# Patient Record
Sex: Female | Born: 1970 | Race: White | Hispanic: No | Smoking: Former smoker
Health system: Southern US, Community
[De-identification: ages and names within clinical notes are randomized; demographics above are authoritative.]

## PROBLEM LIST (undated history)

## (undated) DIAGNOSIS — F419 Anxiety disorder, unspecified: Secondary | ICD-10-CM

## (undated) DIAGNOSIS — K8020 Calculus of gallbladder without cholecystitis without obstruction: Secondary | ICD-10-CM

## (undated) DIAGNOSIS — N83209 Unspecified ovarian cyst, unspecified side: Secondary | ICD-10-CM

## (undated) DIAGNOSIS — N189 Chronic kidney disease, unspecified: Secondary | ICD-10-CM

## (undated) DIAGNOSIS — D649 Anemia, unspecified: Secondary | ICD-10-CM

## (undated) DIAGNOSIS — K219 Gastro-esophageal reflux disease without esophagitis: Secondary | ICD-10-CM

## (undated) DIAGNOSIS — D699 Hemorrhagic condition, unspecified: Secondary | ICD-10-CM

## (undated) DIAGNOSIS — G43909 Migraine, unspecified, not intractable, without status migrainosus: Secondary | ICD-10-CM

## (undated) HISTORY — DX: Gastro-esophageal reflux disease without esophagitis: K21.9

## (undated) HISTORY — DX: Anxiety disorder, unspecified: F41.9

---

## 2011-01-09 DIAGNOSIS — N879 Dysplasia of cervix uteri, unspecified: Secondary | ICD-10-CM | POA: Insufficient documentation

## 2011-03-27 HISTORY — PX: LEEP: SHX91

## 2011-03-27 HISTORY — PX: APPENDECTOMY: SHX54

## 2011-05-22 ENCOUNTER — Emergency Department: Payer: Self-pay | Admitting: *Deleted

## 2011-05-23 LAB — CBC
HCT: 42.8 % (ref 35.0–47.0)
MCH: 27.3 pg (ref 26.0–34.0)
MCHC: 32.8 g/dL (ref 32.0–36.0)
RBC: 5.14 10*6/uL (ref 3.80–5.20)
RDW: 15.8 % — ABNORMAL HIGH (ref 11.5–14.5)

## 2011-05-23 LAB — URINALYSIS, COMPLETE
Bacteria: NONE SEEN
Glucose,UR: NEGATIVE mg/dL (ref 0–75)
Leukocyte Esterase: NEGATIVE
Nitrite: NEGATIVE
Ph: 5 (ref 4.5–8.0)
RBC,UR: 8 /HPF (ref 0–5)

## 2011-05-23 LAB — COMPREHENSIVE METABOLIC PANEL
Albumin: 4 g/dL (ref 3.4–5.0)
Anion Gap: 11 (ref 7–16)
BUN: 8 mg/dL (ref 7–18)
Bilirubin,Total: 0.6 mg/dL (ref 0.2–1.0)
Chloride: 108 mmol/L — ABNORMAL HIGH (ref 98–107)
Creatinine: 0.72 mg/dL (ref 0.60–1.30)
EGFR (African American): >60
Glucose: 89 mg/dL (ref 65–99)
Osmolality: 283 (ref 275–301)
Potassium: 4 mmol/L (ref 3.5–5.1)
SGPT (ALT): 52 U/L
Sodium: 143 mmol/L (ref 136–145)
Total Protein: 8 g/dL (ref 6.4–8.2)

## 2011-11-20 ENCOUNTER — Ambulatory Visit: Payer: Self-pay

## 2011-11-27 ENCOUNTER — Ambulatory Visit: Payer: Self-pay | Admitting: Gynecologic Oncology

## 2012-01-01 ENCOUNTER — Ambulatory Visit: Payer: Self-pay

## 2012-08-06 ENCOUNTER — Ambulatory Visit: Payer: Self-pay

## 2013-02-11 ENCOUNTER — Ambulatory Visit: Payer: Self-pay

## 2013-03-29 LAB — CBC WITH DIFFERENTIAL/PLATELET
BASOS ABS: 0.1 10*3/uL (ref 0.0–0.1)
BASOS PCT: 0.4 %
Eosinophil #: 0.1 10*3/uL (ref 0.0–0.7)
Eosinophil %: 0.6 %
HCT: 44.6 % (ref 35.0–47.0)
HGB: 14.7 g/dL (ref 12.0–16.0)
LYMPHS PCT: 15.6 %
Lymphocyte #: 2.3 10*3/uL (ref 1.0–3.6)
MCH: 27.2 pg (ref 26.0–34.0)
MCHC: 32.9 g/dL (ref 32.0–36.0)
MCV: 82 fL (ref 80–100)
MONO ABS: 0.8 x10 3/mm (ref 0.2–0.9)
Monocyte %: 5.7 %
NEUTROS ABS: 11.2 10*3/uL — AB (ref 1.4–6.5)
Neutrophil %: 77.7 %
PLATELETS: 270 10*3/uL (ref 150–440)
RBC: 5.41 10*6/uL — ABNORMAL HIGH (ref 3.80–5.20)
RDW: 13.2 % (ref 11.5–14.5)
WBC: 14.4 10*3/uL — ABNORMAL HIGH (ref 3.6–11.0)

## 2013-03-29 LAB — COMPREHENSIVE METABOLIC PANEL
Albumin: 3.9 g/dL (ref 3.4–5.0)
Alkaline Phosphatase: 110 U/L
Anion Gap: 4 — ABNORMAL LOW (ref 7–16)
BUN: 10 mg/dL (ref 7–18)
Bilirubin,Total: 0.4 mg/dL (ref 0.2–1.0)
Calcium, Total: 9.3 mg/dL (ref 8.5–10.1)
Chloride: 106 mmol/L (ref 98–107)
Co2: 27 mmol/L (ref 21–32)
Creatinine: 0.75 mg/dL (ref 0.60–1.30)
EGFR (African American): >60
GLUCOSE: 99 mg/dL (ref 65–99)
Osmolality: 273 (ref 275–301)
POTASSIUM: 3.6 mmol/L (ref 3.5–5.1)
SGOT(AST): 22 U/L (ref 15–37)
SGPT (ALT): 43 U/L (ref 12–78)
Sodium: 137 mmol/L (ref 136–145)
TOTAL PROTEIN: 7.9 g/dL (ref 6.4–8.2)

## 2013-03-29 LAB — URINALYSIS, COMPLETE
Bacteria: NONE SEEN
Bilirubin,UR: NEGATIVE
Glucose,UR: NEGATIVE mg/dL (ref 0–75)
KETONE: NEGATIVE
LEUKOCYTE ESTERASE: NEGATIVE
Nitrite: NEGATIVE
PH: 6 (ref 4.5–8.0)
Protein: NEGATIVE
RBC,UR: 4 /HPF (ref 0–5)
SPECIFIC GRAVITY: 1.03 (ref 1.003–1.030)
Squamous Epithelial: 1

## 2013-03-29 LAB — LIPASE, BLOOD: Lipase: 131 U/L (ref 73–393)

## 2013-03-30 ENCOUNTER — Observation Stay: Payer: Self-pay | Admitting: Surgery

## 2013-03-31 LAB — PATHOLOGY REPORT

## 2014-02-08 ENCOUNTER — Emergency Department: Payer: Self-pay | Admitting: Emergency Medicine

## 2014-02-08 LAB — BASIC METABOLIC PANEL
Anion Gap: 5 — ABNORMAL LOW (ref 7–16)
BUN: 8 mg/dL (ref 7–18)
CALCIUM: 8.4 mg/dL — AB (ref 8.5–10.1)
CREATININE: 0.89 mg/dL (ref 0.60–1.30)
Chloride: 108 mmol/L — ABNORMAL HIGH (ref 98–107)
Co2: 26 mmol/L (ref 21–32)
EGFR (Non-African Amer.): >60
GLUCOSE: 122 mg/dL — AB (ref 65–99)
Osmolality: 277 (ref 275–301)
Potassium: 4.1 mmol/L (ref 3.5–5.1)
SODIUM: 139 mmol/L (ref 136–145)

## 2014-02-08 LAB — CBC
HCT: 44.8 % (ref 35.0–47.0)
HGB: 14.6 g/dL (ref 12.0–16.0)
MCH: 28.1 pg (ref 26.0–34.0)
MCHC: 32.6 g/dL (ref 32.0–36.0)
MCV: 86 fL (ref 80–100)
Platelet: 285 10*3/uL (ref 150–440)
RBC: 5.2 10*6/uL (ref 3.80–5.20)
RDW: 13.2 % (ref 11.5–14.5)
WBC: 6.9 10*3/uL (ref 3.6–11.0)

## 2014-02-09 LAB — URINALYSIS, COMPLETE
BACTERIA: NONE SEEN
Bilirubin,UR: NEGATIVE
Glucose,UR: NEGATIVE mg/dL (ref 0–75)
KETONE: NEGATIVE
Leukocyte Esterase: NEGATIVE
Nitrite: NEGATIVE
PH: 7 (ref 4.5–8.0)
Protein: NEGATIVE
RBC,UR: 6 /HPF (ref 0–5)
SPECIFIC GRAVITY: 1.021 (ref 1.003–1.030)
Squamous Epithelial: 4
WBC UR: 1 /HPF (ref 0–5)

## 2014-02-09 LAB — TROPONIN I: Troponin-I: <0.02 ng/mL

## 2014-03-31 ENCOUNTER — Ambulatory Visit: Payer: Self-pay

## 2014-07-17 NOTE — Op Note (Signed)
PATIENT NAME:  Ashley Deleon, Ashley Deleon MR#:  [id number] DATE OF BIRTH:  Jan 07, 1971  DATE OF PROCEDURE:  03/30/2013  PREOPERATIVE DIAGNOSIS: Acute appendicitis.   POSTOPERATIVE DIAGNOSIS: Acute appendicitis.   PROCEDURE: Laparoscopic appendectomy.   SURGEON: Phoebe Perch, M.D.   ANESTHESIA: General with endotracheal tube.   INDICATIONS: This is a patient with progressive right lower quadrant pain and tenderness with signs of peritoneal irritation and a CT scan suggesting appendicitis with leukocytosis. Preoperatively, we discussed rationale for surgery, the options of observation and alternative causes for her pain including gallstones and an ovarian cyst. Preoperatively, we also discussed the risks of bleeding, infection, negative laparoscopy and conversion to an open procedure. This was all reviewed for her. She understood and agreed to proceed.   FINDINGS: Left ovarian cyst, photos taken, normal-appearing cyst, acute appendicitis, non-ruptured with minimal inflammation.   DESCRIPTION OF PROCEDURE: The patient was induced to general anesthesia, Foley catheter was placed and she was given IV antibiotics. She was prepped and draped in a sterile fashion. Marcaine was infiltrated in skin and subcutaneous tissues. Around the periumbilical area, an incision was made. Veress needle was placed. Pneumoperitoneum was obtained and a 5 mm trocar port was placed. The abdominal cavity was explored and under direct vision a 12 mm left lateral port was placed and a 5 mm suprapubic port was placed. An ovarian cyst was identified and photographed. It was quite large, but fairly normal-appearing. No other pathology was noted. The appendix was elevated. The base of the appendix was divided with an Endo GIA standard load and then 3 firings of the vascular load Endo GIA was required to control the mesoappendix. The specimen was passed out through the lateral port site with the aid of an Endo Catch bag. The area was checked for  hemostasis. Multiple clips were utilized on the fatty mesoappendix. This controlled a small arterial hemorrhage. The area was irrigated with copious amounts of normal saline. There was no sign of bleeding or bowel injury. Therefore, the left lateral port site was closed under direct vision with multiple 0 Vicryls utilizing an Endo Close technique. Again, hemostasis, found to be adequate. The pneumoperitoneum was released. All ports were removed; 4-0 subcuticular Monocryl was used at all skin edges. Steri-Strips, Mastisol and sterile dressings were placed. The patient tolerated the procedure well. There were no complications. She was taken to the recovery room in stable condition to be admitted for continued care.   ____________________________ Jerrol Banana. Burt Knack, MD rec:aw D: 03/30/2013 09:44:47 ET T: 03/30/2013 09:48:48 ET JOB#: [id number]  cc: Jerrol Banana. Burt Knack, MD, <Dictator> Florene Glen MD ELECTRONICALLY SIGNED 03/30/2013 18:24

## 2014-07-17 NOTE — Discharge Summary (Signed)
PATIENT NAME:  Ashley Deleon, Ashley Deleon MR#:  [id number] DATE OF BIRTH:  15-Jul-1970  DATE OF ADMISSION:  03/30/2013 DATE OF DISCHARGE:  03/31/2013  DIAGNOSES:  1.  Acute appendicitis. 2.  Gallstones. 3.  Left ovarian cyst. 4.  History of kidney stones.   PROCEDURE: Laparoscopic appendectomy.  HISTORY OF PRESENT ILLNESS AND HOSPITAL COURSE: This is a patient who presented with right lower quadrant pain and tenderness with a CT suggesting possible early acute appendicitis, but she also had gallstones as well as a left ovarian cyst. Her physical exam progressed to showing worsening right lower quadrant tenderness. Therefore, she was taken to the operating room where a diagnostic laparoscopy confirmed the presence of acute appendicitis. Appendectomy was performed laparoscopically. Photos were taken of a large left ovarian cyst, which appeared fairly normal and those photos have been given to the patient to take her gynecologist and an appointment for her gynecologist will be made in the next 2 weeks. She also has gallstones with questionable symptoms, which will be dealt with in the office. She is discharged in stable condition, tolerating a regular diet. Her only new medication would be Vicodin as needed for pain and she will follow up in my office in 10 days and with a gynecologist in 2 weeks.   ____________________________ Jerrol Banana Burt Knack, MD rec:aw D: 03/31/2013 08:18:38 ET T: 03/31/2013 08:33:13 ET JOB#: [id number]  cc: Jerrol Banana. Burt Knack, MD, <Dictator> Florene Glen MD ELECTRONICALLY SIGNED 03/31/2013 12:41

## 2014-07-17 NOTE — H&P (Signed)
   Subjective/Chief Complaint RLQ pain, nausea   History of Present Illness Ms. Kaney is a pleasant 44 yo F with a history of nephrolithiasis who presnts with now 7 hours of acute onset RLQ pain.  She says that it began suddenly.  Has not improved.  Worse with movement.  Also associated with some nausea but not no vomiting.  Different from her prior kidney stones in that that pain begins in her back and radiates to her RLQ.  Last bm was yesterday and normal.  No sick contacts, no unusual ingestions.  + subjective chills and shortness of breath.  No pain with urination.  Had a recent history of epigastric pain and was treated for H. pylori.  No anorexia.   Past History H/o nephrolithiasis H/o D and C H/o gestational diabetes H/o leep   Past Med/Surgical Hx:  Gestational Diabetes:   Kidney Stones:   LEEP: 04/24/11  ALLERGIES:  No Known Allergies:   Family and Social History:  Family History Diabetes Mellitus  Cancer  FH of cirrhosis, cervical cancer   Social History negative tobacco, negative ETOH   Place of Living Lives in [place], 5 children   Review of Systems:  Subjective/Chief Complaint RLQ pain   Fever/Chills Yes   Cough No   Sputum No   Abdominal Pain Yes   Diarrhea No   Constipation No   Nausea/Vomiting Yes   SOB/DOE No   Chest Pain No   Dysuria No   Tolerating PT No   Tolerating Diet Nauseated   Physical Exam:  GEN well developed, well nourished, no acute distress, obese   HEENT pale conjunctivae   RESP normal resp effort  clear BS  no use of accessory muscles   CARD regular rate  no murmur  no thrills   ABD positive tenderness  denies Flank Tenderness  no liver/spleen enlargement  no hernia  soft  normal BS  no Abdominal Bruits  no Adominal Mass   EXTR negative cyanosis/clubbing, negative edema   SKIN normal to palpation, No rashes, No ulcers   NEURO cranial nerves intact, negative rigidity, negative tremor, follows commands   PSYCH A+O to  time, place, person, good insight    Assessment/Admission Diagnosis Ms. Wurtz is a pleasant 44 yo F with a history of RLQ pain, nausea.  + WBC 14.  CT scan with enlarged appendix with minimal periappendiceal stranding.  Possible early appendicitis.   Plan Admit for IVF, serial abdominal exam, observation.  Will defer possible appendectomy to daytime surgeon Dr. Burt Knack if he agrees with my assessment and if continues to have pain.   Electronic Signatures: Floyde Parkins (MD)  (Signed 05-Jan-15 04:14)  Authored: CHIEF COMPLAINT and HISTORY, PAST MEDICAL/SURGIAL HISTORY, ALLERGIES, FAMILY AND SOCIAL HISTORY, REVIEW OF SYSTEMS, PHYSICAL EXAM, ASSESSMENT AND PLAN   Last Updated: 05-Jan-15 04:14 by Floyde Parkins (MD)

## 2015-03-19 ENCOUNTER — Encounter: Payer: Self-pay | Admitting: *Deleted

## 2015-03-19 ENCOUNTER — Other Ambulatory Visit: Payer: Self-pay

## 2015-03-19 ENCOUNTER — Emergency Department: Payer: Self-pay

## 2015-03-19 ENCOUNTER — Emergency Department
Admission: EM | Admit: 2015-03-19 | Discharge: 2015-03-19 | Disposition: A | Discharge status: Home / Self Care | Payer: Self-pay | Attending: Emergency Medicine | Admitting: Emergency Medicine

## 2015-03-19 DIAGNOSIS — R1011 Right upper quadrant pain: Secondary | ICD-10-CM

## 2015-03-19 DIAGNOSIS — K8020 Calculus of gallbladder without cholecystitis without obstruction: Secondary | ICD-10-CM

## 2015-03-19 DIAGNOSIS — Z87891 Personal history of nicotine dependence: Secondary | ICD-10-CM | POA: Insufficient documentation

## 2015-03-19 DIAGNOSIS — K30 Functional dyspepsia: Secondary | ICD-10-CM

## 2015-03-19 DIAGNOSIS — Z3202 Encounter for pregnancy test, result negative: Secondary | ICD-10-CM | POA: Insufficient documentation

## 2015-03-19 HISTORY — DX: Calculus of gallbladder without cholecystitis without obstruction: K80.20

## 2015-03-19 HISTORY — DX: Unspecified ovarian cyst, unspecified side: N83.209

## 2015-03-19 HISTORY — DX: Migraine, unspecified, not intractable, without status migrainosus: G43.909

## 2015-03-19 LAB — CBC
HCT: 42.7 % (ref 35.0–47.0)
HEMOGLOBIN: 14.3 g/dL (ref 12.0–16.0)
MCH: 27.9 pg (ref 26.0–34.0)
MCHC: 33.4 g/dL (ref 32.0–36.0)
MCV: 83.4 fL (ref 80.0–100.0)
Platelets: 236 10*3/uL (ref 150–440)
RBC: 5.12 MIL/uL (ref 3.80–5.20)
RDW: 13.3 % (ref 11.5–14.5)
WBC: 10.5 10*3/uL (ref 3.6–11.0)

## 2015-03-19 LAB — URINALYSIS COMPLETE WITH MICROSCOPIC (ARMC ONLY)
BILIRUBIN URINE: NEGATIVE
Bacteria, UA: NONE SEEN
Glucose, UA: NEGATIVE mg/dL
LEUKOCYTES UA: NEGATIVE
NITRITE: NEGATIVE
Protein, ur: 30 mg/dL — AB
SPECIFIC GRAVITY, URINE: 1.031 — AB (ref 1.005–1.030)
pH: 5 (ref 5.0–8.0)

## 2015-03-19 LAB — COMPREHENSIVE METABOLIC PANEL
ALK PHOS: 90 U/L (ref 38–126)
ALT: 56 U/L — ABNORMAL HIGH (ref 14–54)
ANION GAP: 5 (ref 5–15)
AST: 52 U/L — ABNORMAL HIGH (ref 15–41)
Albumin: 4 g/dL (ref 3.5–5.0)
BILIRUBIN TOTAL: 0.5 mg/dL (ref 0.3–1.2)
BUN: 11 mg/dL (ref 6–20)
CALCIUM: 8.8 mg/dL — AB (ref 8.9–10.3)
CO2: 25 mmol/L (ref 22–32)
Chloride: 108 mmol/L (ref 101–111)
Creatinine, Ser: 0.63 mg/dL (ref 0.44–1.00)
GFR calc non Af Amer: >60 mL/min (ref >60)
Glucose, Bld: 125 mg/dL — ABNORMAL HIGH (ref 65–99)
POTASSIUM: 3.7 mmol/L (ref 3.5–5.1)
Sodium: 138 mmol/L (ref 135–145)
TOTAL PROTEIN: 7.2 g/dL (ref 6.5–8.1)

## 2015-03-19 LAB — LIPASE, BLOOD: Lipase: 30 U/L (ref 11–51)

## 2015-03-19 LAB — POCT PREGNANCY, URINE: PREG TEST UR: NEGATIVE

## 2015-03-19 MED ORDER — IOHEXOL 240 MG/ML SOLN
25.0000 mL | Freq: Once | INTRAMUSCULAR | Status: AC | PRN
  Administered 2015-03-19: 25 mL via ORAL

## 2015-03-19 MED ORDER — ONDANSETRON HCL 4 MG/2ML IJ SOLN
4.0000 mg | Freq: Once | INTRAMUSCULAR | Status: AC | PRN
  Administered 2015-03-19: 4 mg via INTRAVENOUS
  Filled 2015-03-19: qty 2

## 2015-03-19 MED ORDER — IOHEXOL 350 MG/ML SOLN
125.0000 mL | Freq: Once | INTRAVENOUS | Status: AC | PRN
  Administered 2015-03-19: 125 mL via INTRAVENOUS

## 2015-03-19 MED ORDER — GI COCKTAIL ~~LOC~~
30.0000 mL | Freq: Once | ORAL | Status: AC
  Administered 2015-03-19: 30 mL via ORAL
  Filled 2015-03-19: qty 30

## 2015-03-19 NOTE — ED Notes (Signed)
Reviewed d/c instructions and follow-up care with pt. Pt verbalized understanding 

## 2015-03-19 NOTE — ED Notes (Signed)
Patient transported to Ultrasound 

## 2015-03-19 NOTE — Discharge Instructions (Signed)
You were evaluated for upper abdominal pain, and found to have gallstones. I also suspect your symptoms may be coming from acid reflux/indigestion.  Take over-the-counter omeprazole 40 mg daily for 2 weeks. You may try Maalox as needed for indigestion relief. Avoid fatty and spicy foods for 2 weeks.  Return to the emergency department for any worsening condition including any worsening abdominal pain, nausea, vomiting blood, chest pain, trouble breathing, black or bloody stools, fever, or any other symptoms concerning to you.   Biliary Colic Biliary colic is a pain in the upper abdomen. The pain:  Is usually felt on the right side of the abdomen, but it may also be felt in the center of the abdomen, just below the breastbone (sternum).  May spread back toward the right shoulder blade.  May be steady or irregular.  May be accompanied by nausea and vomiting. Most of the time, the pain goes away in 1-5 hours. After the most intense pain passes, the abdomen may continue to ache mildly for about 24 hours. Biliary colic is caused by a blockage in the bile duct. The bile duct is a pathway that carries bile--a liquid that helps to digest fats--from the gallbladder to the small intestine. Biliary colic usually occurs after eating, when the digestive system demands bile. The pain develops when muscle cells contract forcefully to try to move the blockage so that bile can get by. HOME CARE INSTRUCTIONS  Take medicines only as directed by your health care provider.  Drink enough fluid to keep your urine clear or pale yellow.  Avoid fatty, greasy, and fried foods. These kinds of foods increase your body's demand for bile.  Avoid any foods that make your pain worse.  Avoid overeating.  Avoid having a large meal after fasting. SEEK MEDICAL CARE IF:  You develop a fever.  Your pain gets worse.  You vomit.  You develop nausea that prevents you from eating and drinking. SEEK IMMEDIATE MEDICAL  CARE IF:  You suddenly develop a fever and shaking chills.  You develop a yellowish discoloration (jaundice) of:  Skin.  Whites of the eyes.  Mucous membranes.  You have continuous or severe pain that is not relieved with medicines.  You have nausea and vomiting that is not relieved with medicines.  You develop dizziness or you faint.   This information is not intended to replace advice given to you by your health care provider. Make sure you discuss any questions you have with your health care provider.   Document Released: 08/13/2005 Document Revised: 07/27/2014 Document Reviewed: 12/22/2013 Elsevier Interactive Patient Education 2016 [place]. Gastroesophageal Reflux Disease, Adult Normally, food travels down the esophagus and stays in the stomach to be digested. However, when a person has gastroesophageal reflux disease (GERD), food and stomach acid move back up into the esophagus. When this happens, the esophagus becomes sore and inflamed. Over time, GERD can create small holes (ulcers) in the lining of the esophagus.  CAUSES This condition is caused by a problem with the muscle between the esophagus and the stomach (lower esophageal sphincter, or LES). Normally, the LES muscle closes after food passes through the esophagus to the stomach. When the LES is weakened or abnormal, it does not close properly, and that allows food and stomach acid to go back up into the esophagus. The LES can be weakened by certain dietary substances, medicines, and medical conditions, including:  Tobacco use.  Pregnancy.  Having a hiatal hernia.  Heavy alcohol use.  Certain  foods and beverages, such as coffee, chocolate, onions, and peppermint. RISK FACTORS This condition is more likely to develop in:  People who have an increased body weight.  People who have connective tissue disorders.  People who use NSAID medicines. SYMPTOMS Symptoms of this condition  include:  Heartburn.  Difficult or painful swallowing.  The feeling of having a lump in the throat.  Abitter taste in the mouth.  Bad breath.  Having a large amount of saliva.  Having an upset or bloated stomach.  Belching.  Chest pain.  Shortness of breath or wheezing.  Ongoing (chronic) cough or a night-time cough.  Wearing away of tooth enamel.  Weight loss. Different conditions can cause chest pain. Make sure to see your health care provider if you experience chest pain. DIAGNOSIS Your health care provider will take a medical history and perform a physical exam. To determine if you have mild or severe GERD, your health care provider may also monitor how you respond to treatment. You may also have other tests, including:  An endoscopy toexamine your stomach and esophagus with a small camera.  A test thatmeasures the acidity level in your esophagus.  A test thatmeasures how much pressure is on your esophagus.  A barium swallow or modified barium swallow to show the shape, size, and functioning of your esophagus. TREATMENT The goal of treatment is to help relieve your symptoms and to prevent complications. Treatment for this condition may vary depending on how severe your symptoms are. Your health care provider may recommend:  Changes to your diet.  Medicine.  Surgery. HOME CARE INSTRUCTIONS Diet  Follow a diet as recommended by your health care provider. This may involve avoiding foods and drinks such as:  Coffee and tea (with or without caffeine).  Drinks that containalcohol.  Energy drinks and sports drinks.  Carbonated drinks or sodas.  Chocolate and cocoa.  Peppermint and mint flavorings.  Garlic and onions.  Horseradish.  Spicy and acidic foods, including peppers, chili powder, curry powder, vinegar, hot sauces, and barbecue sauce.  Citrus fruit juices and citrus fruits, such as oranges, lemons, and limes.  Tomato-based foods, such as  red sauce, chili, salsa, and pizza with red sauce.  Fried and fatty foods, such as donuts, french fries, potato chips, and high-fat dressings.  High-fat meats, such as hot dogs and fatty cuts of red and white meats, such as rib eye steak, sausage, ham, and bacon.  High-fat dairy items, such as whole milk, butter, and cream cheese.  Eat small, frequent meals instead of large meals.  Avoid drinking large amounts of liquid with your meals.  Avoid eating meals during the 2-3 hours before bedtime.  Avoid lying down right after you eat.  Do not exercise right after you eat. General Instructions  Pay attention to any changes in your symptoms.  Take over-the-counter and prescription medicines only as told by your health care provider. Do not take aspirin, ibuprofen, or other NSAIDs unless your health care provider told you to do so.  Do not use any tobacco products, including cigarettes, chewing tobacco, and e-cigarettes. If you need help quitting, ask your health care provider.  Wear loose-fitting clothing. Do not wear anything tight around your waist that causes pressure on your abdomen.  Raise (elevate) the head of your bed 6 inches (15cm).  Try to reduce your stress, such as with yoga or meditation. If you need help reducing stress, ask your health care provider.  If you are overweight, reduce your  weight to an amount that is healthy for you. Ask your health care provider for guidance about a safe weight loss goal.  Keep all follow-up visits as told by your health care provider. This is important. SEEK MEDICAL CARE IF:  You have new symptoms.  You have unexplained weight loss.  You have difficulty swallowing, or it hurts to swallow.  You have wheezing or a persistent cough.  Your symptoms do not improve with treatment.  You have a hoarse voice. SEEK IMMEDIATE MEDICAL CARE IF:  You have pain in your arms, neck, jaw, teeth, or back.  You feel sweaty, dizzy, or  light-headed.  You have chest pain or shortness of breath.  You vomit and your vomit looks like blood or coffee grounds.  You faint.  Your stool is bloody or black.  You cannot swallow, drink, or eat.   This information is not intended to replace advice given to you by your health care provider. Make sure you discuss any questions you have with your health care provider.   Document Released: 12/20/2004 Document Revised: 12/01/2014 Document Reviewed: 07/07/2014 Elsevier Interactive Patient Education [employer].

## 2015-03-19 NOTE — ED Provider Notes (Signed)
[facility] Emergency Department Provider Note   ____________________________________________  Time seen:  I have reviewed the triage vital signs and the triage nursing note.  HISTORY  Chief Complaint Abdominal Pain   Historian Patient  HPI Ashley Deleon is a 44 y.o. female with history of prior gallstones and appendectomy, here for acute onset epigastric upper abd pain few hours ago.  Pos for nausea, no vomiting.  Few days ago had abd cramping followed by diarrhea, but that's resolved.  No fever.  Symptoms date seem to be worse after eating. She had seen a surgeon previously after diagnosis of gallstones, but she was told her gallbladder would "be good for another for 5 years. "  Pain described as moderate.    Past Medical History  Diagnosis Date  . Gallstone   . Migraine   . Ovarian cyst     left    There are no active problems to display for this patient.   Past Surgical History  Procedure Laterality Date  . Appendectomy      No current outpatient prescriptions on file.  Allergies Review of patient's allergies indicates no known allergies.  History reviewed. No pertinent family history.  Social History Social History  Substance Use Topics  . Smoking status: Former Research scientist (life sciences)  . Smokeless tobacco: None  . Alcohol Use: No    Review of Systems  Constitutional: Negative for fever. Eyes: Negative for visual changes. ENT: Negative for sore throat. Cardiovascular: Negative for chest pain. Respiratory: The pain took her breath away, but no shortness of breath or coughing. Gastrointestinal: Negative for vomiting and diarrhea. Genitourinary: Negative for dysuria. Musculoskeletal: Negative for back pain. Skin: Negative for rash. Neurological: Negative for headache. 10 point Review of Systems otherwise negative ____________________________________________   PHYSICAL EXAM:  VITAL SIGNS: ED Triage Vitals  Enc Vitals Group     BP  03/19/15 1603 140/56 mmHg     Pulse Rate 03/19/15 1603 73     Resp 03/19/15 1603 16     Temp 03/19/15 1603 98.2 F (36.8 C)     Temp Source 03/19/15 1603 Oral     SpO2 03/19/15 1603 100 %     Weight 03/19/15 1603 260 lb (117.935 kg)     Height 03/19/15 1603 5\' 9"  (1.753 m)     Head Cir --      Peak Flow --      Pain Score 03/19/15 1557 6     Pain Loc --      Pain Edu? --      Excl. in [place]? --      Constitutional: Alert and oriented. Well appearing and in no distress. Eyes: Conjunctivae are normal. PERRL. Normal extraocular movements. ENT   Head: Normocephalic and atraumatic.   Nose: No congestion/rhinnorhea.   Mouth/Throat: Mucous membranes are moist.   Neck: No stridor. Cardiovascular/Chest: Normal rate, regular rhythm.  No murmurs, rubs, or gallops. Respiratory: Normal respiratory effort without tachypnea nor retractions. Breath sounds are clear and equal bilaterally. No wheezes/rales/rhonchi. Gastrointestinal: Soft. No distention, no guarding, no rebound. Moderate ruq, luq and epigastric pain  Genitourinary/rectal:Deferred Musculoskeletal: Nontender with normal range of motion in all extremities. No joint effusions.  No lower extremity tenderness.  No edema. Neurologic:  Normal speech and language. No gross or focal neurologic deficits are appreciated. Skin:  Skin is warm, dry and intact. No rash noted. Psychiatric: Mood and affect are normal. Speech and behavior are normal. Patient exhibits appropriate insight and judgment.  ____________________________________________  EKG I, Lisa Roca, MD, the attending physician have personally viewed and interpreted all ECGs.  66 bpm. Normal sinus rhythm. Narrow QRS. Normal axis. Normal ST and T-wave ____________________________________________  LABS (pertinent positives/negatives)  Lipase 30 Comprehensive metabolic panel significant for AST 52, a LT 56 otherwise without significant abnormality White blood count  10.5, hemoglobin 14.3 and platelet count 236  ____________________________________________  RADIOLOGY All Xrays were viewed by me. Imaging interpreted by Radiologist.  Ultrasound right upper quadrant:  IMPRESSION: 1. Cholelithiasis without other ultrasound evidence of cholecystitis or biliary obstruction. 2. Two nonspecific liver lesions, possibly benign hemangiomas but nonspecific. These were not evident on the previous noncontrast CT scans. Recommend elective outpatient liver protocol MR or CT with contrast, which may be diagnostic for benign disease and help exclude primary or secondary neoplasm.  CT abdomen and pelvis with contrast:  IMPRESSION: 1. Benign-appearing lesions of the liver are compatible with hemangiomas. 2. Cholelithiasis without cholecystitis. 3. 3.5 cm cyst in the right ovary. This is almost certainly benign, and no specific imaging follow up is recommended according to the Society of Radiologists in Ultrasound 2010 Consensus Conference Statement (D Clovis Riley et al. Management of Asymptomatic Ovarian and Other Adnexal Cysts Imaged at [place]: Society of Radiologists in [place] Statement 2010. Radiology 256 (Sept 2010): [id number].). __________________________________________  PROCEDURES  Procedure(s) performed: None  Critical Care performed: None  ____________________________________________   ED COURSE / ASSESSMENT AND PLAN  CONSULTATIONS: None  Pertinent labs & imaging results that were available during my care of the patient were reviewed by me and considered in my medical decision making (see chart for details).   Symptoms in the epigastrium sound most likely like acid reflux/indigestion, but with history of gallstones, and minimally elevated AST and LT, patient was imaged with an ultrasound. There were no findings of emergency condition such as obstruction or infection.  I discussed with the patient the finding of the liver  lesions, and she wanted to proceed with CT of the abdomen/pelvis.  CT shows liver lesions to be consistent with benign hemangiomas.  Patient feels much better, is okay for outpatient management and follow-up.  Patient / Family / Caregiver informed of clinical course, medical decision-making process, and agree with plan.   I discussed return precautions, follow-up instructions, and discharged instructions with patient and/or family.  ___________________________________________   FINAL CLINICAL IMPRESSION(S) / ED DIAGNOSES   Final diagnoses:  RUQ pain  Gallstones  Indigestion       Lisa Roca, MD 03/19/15 2033

## 2015-03-19 NOTE — ED Notes (Addendum)
Pt reports sudden onset of upper right and left abd pain that radiates to back and shoulders. Pt reports at 1400 today having an episode of SOB and became diaphoretic. Pt denies SOB at this time. Pt reports having a hx of gallstones. PT reports nausea but denies vomiting.. Pt reports pain increased after eating a meal today.

## 2015-04-15 ENCOUNTER — Ambulatory Visit (INDEPENDENT_AMBULATORY_CARE_PROVIDER_SITE_OTHER): Payer: BLUE CROSS/BLUE SHIELD | Admitting: Surgery

## 2015-04-15 ENCOUNTER — Encounter: Payer: Self-pay | Admitting: Surgery

## 2015-04-15 VITALS — BP 124/79 | HR 69 | Temp 98.1°F | Ht 69.0 in | Wt 264.8 lb

## 2015-04-15 DIAGNOSIS — K8020 Calculus of gallbladder without cholecystitis without obstruction: Secondary | ICD-10-CM | POA: Diagnosis not present

## 2015-04-15 NOTE — Patient Instructions (Signed)
You are requesting to have your gallbladder removed. We will arrange this to be done the week of 05/09/15.   Please refer to your [facility]) Pre-care information sheet you have been given today.

## 2015-04-15 NOTE — Progress Notes (Signed)
  Surgical Consultation  04/15/2015  Ashley Deleon is an 45 y.o. female.   CC: Gallstones  HPI: This a patient with a long-standing knowledge of gallstones who is had recurrent epigastric and right upper quadrant pain sometimes left upper quadrant pain with nausea but no emesis. She's been to the emergency room. Fevers or chills no jaundice or acholic stools. This is often but not always postprandial, often with FFI.  Past Medical History  Diagnosis Date  . Gallstone   . Migraine   . Ovarian cyst     left    Past Surgical History  Procedure Laterality Date  . Appendectomy      No family history on file.  Social History:  reports that she has quit smoking. She does not have any smokeless tobacco history on file. She reports that she does not drink alcohol or use illicit drugs.  Allergies: No Known Allergies  Medications reviewed.   Review of Systems:   Review of Systems  Constitutional: Negative for fever and chills.  HENT: Negative.   Eyes: Negative.   Respiratory: Negative.   Cardiovascular: Negative.   Gastrointestinal: Positive for heartburn, nausea and abdominal pain. Negative for vomiting, diarrhea, constipation, blood in stool and melena.       Right upper and left upper quadrant pain with epigastric pain often postprandial  Genitourinary: Negative.   Musculoskeletal: Negative.   Skin: Negative.   Neurological: Negative.   Endo/Heme/Allergies: Negative.   Psychiatric/Behavioral: Negative.      Physical Exam:  There were no vitals taken for this visit.  Physical Exam  Constitutional: She is oriented to person, place, and time and well-developed, well-nourished, and in no distress. No distress.  HENT:  Head: Normocephalic and atraumatic.  Eyes: Pupils are equal, round, and reactive to light. Right eye exhibits no discharge. Left eye exhibits no discharge. No scleral icterus.  Neck: Normal range of motion.  Cardiovascular: Normal rate, regular rhythm and  normal heart sounds.   Pulmonary/Chest: Effort normal and breath sounds normal. No respiratory distress. She has no wheezes. She has no rales.  Abdominal: Soft. She exhibits no distension. There is no tenderness. There is no rebound and no guarding.  Musculoskeletal: Normal range of motion. She exhibits no edema.  Lymphadenopathy:    She has no cervical adenopathy.  Neurological: She is alert and oriented to person, place, and time.  Skin: Skin is warm and dry. She is not diaphoretic.  Psychiatric: Mood and affect normal.  Vitals reviewed.     No results found for this or any previous visit (from the past 48 hour(s)). No results found.  Assessment/Plan:  This patient with known gallstones and slightly elevated liver function tests on the single draw. She would likely benefit from laparoscopic cholecystectomy for control of the symptoms. The options of observation of been reviewed the risks of bleeding infection recurrence of symptoms failure to resolve her symptoms (procedure bile duct damage bile duct leak retained common bile duct stone any of which could require further surgery and/or ERCP stent and papillotomy were all reviewed with her she understood and agreed to proceed  Florene Glen, MD, FACS

## 2015-04-18 ENCOUNTER — Telehealth: Payer: Self-pay | Admitting: Surgery

## 2015-04-18 NOTE — Telephone Encounter (Signed)
Ashley Deleon, patient would like to speak with you about her insurance and her upcoming surgery - 05/10/15 with Dr Burt Knack. She is unsure if her insurance will cover surgeries and if it doesn't what her options would be. Please call and advise. Thanks.

## 2015-04-18 NOTE — Telephone Encounter (Signed)
I have called patient back after checking her outpatient benefits. Patient has been advised that her insurance does not cover inpatient or outpatient services. I have discussed payment options with her and she stated that she will call be back before the end of tomorrow's business day with a decision on keeping her surgery date of 05/10/15.  Physician estimate: [phone].

## 2015-04-19 NOTE — Telephone Encounter (Signed)
Pt advised of pre op date/time and sx date. Sx: 05/10/15 with Dr Maeola Sarah cholecystectomy Pre op: 05/02/15 between 9-1:00pm--Phone.   Patient has been advised that her insurance does not cover outpatient services and she stated that she will come in and make a payment on surgery deposit of 500.00 prior to surgery.

## 2015-05-02 ENCOUNTER — Inpatient Hospital Stay: Admission: RE | Admit: 2015-05-02 | Payer: Self-pay | Source: Ambulatory Visit

## 2015-05-02 ENCOUNTER — Encounter: Payer: Self-pay | Admitting: *Deleted

## 2015-05-02 NOTE — Patient Instructions (Signed)
  Your procedure is scheduled on: 05-10-15 (TUESDAY) Report to [facility] To find out your arrival time please call [phone] between 1PM - 3PM on 05-09-15 [facility])  Remember: Instructions that are not followed completely may result in serious medical risk, up to and including death, or upon the discretion of your surgeon and anesthesiologist your surgery may need to be rescheduled.    _X___ 1. Do not eat food or drink liquids after midnight. No gum chewing or hard candies.     _X___ 2. No Alcohol for 24 hours before or after surgery.   ____ 3. Bring all medications with you on the day of surgery if instructed.    _X___ 4. Notify your doctor if there is any change in your medical condition     (cold, fever, infections).     Do not wear jewelry, make-up, hairpins, clips or nail polish.  Do not wear lotions, powders, or perfumes. You may wear deodorant.  Do not shave 48 hours prior to surgery. Men may shave face and neck.  Do not bring valuables to the hospital.    [facility] is not responsible for any belongings or valuables.               Contacts, dentures or bridgework may not be worn into surgery.  Leave your suitcase in the car. After surgery it may be brought to your room.  For patients admitted to the hospital, discharge time is determined by your treatment team.   Patients discharged the day of surgery will not be allowed to drive home.   Please read over the following fact sheets that you were given:     ____ Take these medicines the morning of surgery with A SIP OF WATER:    1. NONE  2.   3.   4.  5.  6.  ____ Fleet Enema (as directed)   _X___ Use CHG Soap as directed  ____ Use inhalers on the day of surgery  ____ Stop metformin 2 days prior to surgery    ____ Take 1/2 of usual insulin dose the night before surgery and none on the morning of surgery.   ____ Stop Coumadin/Plavix/aspirin-N/A  _X___ Stop  Anti-inflammatories-STOP IBUPROFEN NOW-NO NSAIDS OR ASPIRIN PRODUCTS-TYLENOL OK TO TAKE   ____ Stop supplements until after surgery.    ____ Bring C-Pap to the hospital.

## 2015-05-04 ENCOUNTER — Encounter
Admission: RE | Admit: 2015-05-04 | Discharge: 2015-05-04 | Disposition: A | Discharge status: Home / Self Care | Payer: BLUE CROSS/BLUE SHIELD | Source: Ambulatory Visit | Attending: Surgery | Admitting: Surgery

## 2015-05-04 DIAGNOSIS — Z01812 Encounter for preprocedural laboratory examination: Secondary | ICD-10-CM | POA: Insufficient documentation

## 2015-05-04 LAB — CBC WITH DIFFERENTIAL/PLATELET
BASOS ABS: 0 10*3/uL (ref 0–0.1)
BASOS PCT: 0 %
EOS PCT: 2 %
Eosinophils Absolute: 0.1 10*3/uL (ref 0–0.7)
HEMATOCRIT: 41.9 % (ref 35.0–47.0)
Hemoglobin: 14 g/dL (ref 12.0–16.0)
LYMPHS PCT: 27 %
Lymphs Abs: 1.8 10*3/uL (ref 1.0–3.6)
MCH: 27.7 pg (ref 26.0–34.0)
MCHC: 33.3 g/dL (ref 32.0–36.0)
MCV: 83.1 fL (ref 80.0–100.0)
MONO ABS: 0.5 10*3/uL (ref 0.2–0.9)
Monocytes Relative: 8 %
NEUTROS ABS: 4.1 10*3/uL (ref 1.4–6.5)
Neutrophils Relative %: 63 %
PLATELETS: 236 10*3/uL (ref 150–440)
RBC: 5.04 MIL/uL (ref 3.80–5.20)
RDW: 13.2 % (ref 11.5–14.5)
WBC: 6.6 10*3/uL (ref 3.6–11.0)

## 2015-05-04 LAB — COMPREHENSIVE METABOLIC PANEL WITH GFR
ALT: 24 U/L (ref 14–54)
AST: 17 U/L (ref 15–41)
Albumin: 3.9 g/dL (ref 3.5–5.0)
Alkaline Phosphatase: 84 U/L (ref 38–126)
Anion gap: 6 (ref 5–15)
BUN: 16 mg/dL (ref 6–20)
CO2: 25 mmol/L (ref 22–32)
Calcium: 9.3 mg/dL (ref 8.9–10.3)
Chloride: 108 mmol/L (ref 101–111)
Creatinine, Ser: 0.58 mg/dL (ref 0.44–1.00)
GFR calc Af Amer: >60 mL/min
GFR calc non Af Amer: >60 mL/min
Glucose, Bld: 89 mg/dL (ref 65–99)
Potassium: 4.2 mmol/L (ref 3.5–5.1)
Sodium: 139 mmol/L (ref 135–145)
Total Bilirubin: 0.4 mg/dL (ref 0.3–1.2)
Total Protein: 7.2 g/dL (ref 6.5–8.1)

## 2015-05-10 ENCOUNTER — Encounter
Admission: RE | Disposition: A | Discharge status: Home / Self Care | Payer: Self-pay | Source: Ambulatory Visit | Attending: Surgery

## 2015-05-10 ENCOUNTER — Ambulatory Visit
Admission: RE | Admit: 2015-05-10 | Discharge: 2015-05-10 | Disposition: A | Discharge status: Home / Self Care | Payer: BLUE CROSS/BLUE SHIELD | Source: Ambulatory Visit | Attending: Surgery | Admitting: Surgery

## 2015-05-10 ENCOUNTER — Encounter: Payer: Self-pay | Admitting: *Deleted

## 2015-05-10 ENCOUNTER — Ambulatory Visit: Payer: BLUE CROSS/BLUE SHIELD | Admitting: Anesthesiology

## 2015-05-10 DIAGNOSIS — Z87442 Personal history of urinary calculi: Secondary | ICD-10-CM | POA: Diagnosis not present

## 2015-05-10 DIAGNOSIS — K8050 Calculus of bile duct without cholangitis or cholecystitis without obstruction: Secondary | ICD-10-CM | POA: Insufficient documentation

## 2015-05-10 DIAGNOSIS — Z9049 Acquired absence of other specified parts of digestive tract: Secondary | ICD-10-CM | POA: Insufficient documentation

## 2015-05-10 DIAGNOSIS — Z8049 Family history of malignant neoplasm of other genital organs: Secondary | ICD-10-CM | POA: Insufficient documentation

## 2015-05-10 DIAGNOSIS — Z9889 Other specified postprocedural states: Secondary | ICD-10-CM | POA: Insufficient documentation

## 2015-05-10 DIAGNOSIS — N83202 Unspecified ovarian cyst, left side: Secondary | ICD-10-CM | POA: Insufficient documentation

## 2015-05-10 DIAGNOSIS — Z79899 Other long term (current) drug therapy: Secondary | ICD-10-CM | POA: Insufficient documentation

## 2015-05-10 DIAGNOSIS — Z833 Family history of diabetes mellitus: Secondary | ICD-10-CM | POA: Insufficient documentation

## 2015-05-10 DIAGNOSIS — Z79891 Long term (current) use of opiate analgesic: Secondary | ICD-10-CM | POA: Diagnosis not present

## 2015-05-10 DIAGNOSIS — K8080 Other cholelithiasis without obstruction: Secondary | ICD-10-CM | POA: Diagnosis present

## 2015-05-10 DIAGNOSIS — K219 Gastro-esophageal reflux disease without esophagitis: Secondary | ICD-10-CM | POA: Insufficient documentation

## 2015-05-10 DIAGNOSIS — G43909 Migraine, unspecified, not intractable, without status migrainosus: Secondary | ICD-10-CM | POA: Diagnosis not present

## 2015-05-10 DIAGNOSIS — K8010 Calculus of gallbladder with chronic cholecystitis without obstruction: Secondary | ICD-10-CM | POA: Insufficient documentation

## 2015-05-10 DIAGNOSIS — Z791 Long term (current) use of non-steroidal anti-inflammatories (NSAID): Secondary | ICD-10-CM | POA: Insufficient documentation

## 2015-05-10 HISTORY — PX: CHOLECYSTECTOMY: SHX55

## 2015-05-10 HISTORY — DX: Chronic kidney disease, unspecified: N18.9

## 2015-05-10 HISTORY — DX: Hemorrhagic condition, unspecified: D69.9

## 2015-05-10 HISTORY — DX: Anemia, unspecified: D64.9

## 2015-05-10 LAB — POCT PREGNANCY, URINE: Preg Test, Ur: NEGATIVE

## 2015-05-10 SURGERY — LAPAROSCOPIC CHOLECYSTECTOMY WITH INTRAOPERATIVE CHOLANGIOGRAM
Anesthesia: General | Wound class: Clean Contaminated

## 2015-05-10 MED ORDER — MIDAZOLAM HCL 2 MG/2ML IJ SOLN
INTRAMUSCULAR | Status: DC | PRN
  Administered 2015-05-10: 2 mg via INTRAVENOUS

## 2015-05-10 MED ORDER — PROMETHAZINE HCL 25 MG/ML IJ SOLN: 6.2500 mg | INTRAMUSCULAR | Status: DC | PRN

## 2015-05-10 MED ORDER — FAMOTIDINE 20 MG PO TABS
20.0000 mg | ORAL_TABLET | Freq: Once | ORAL | Status: AC
  Administered 2015-05-10: 20 mg via ORAL

## 2015-05-10 MED ORDER — HEPARIN SODIUM (PORCINE) 5000 UNIT/ML IJ SOLN
5000.0000 [IU] | Freq: Once | INTRAMUSCULAR | Status: AC
  Administered 2015-05-10: 5000 [IU] via SUBCUTANEOUS

## 2015-05-10 MED ORDER — BUPIVACAINE-EPINEPHRINE (PF) 0.25% -1:200000 IJ SOLN
INTRAMUSCULAR | Status: DC | PRN
  Administered 2015-05-10: 30 mL

## 2015-05-10 MED ORDER — FENTANYL CITRATE (PF) 100 MCG/2ML IJ SOLN
INTRAMUSCULAR | Status: AC
  Administered 2015-05-10: 25 ug via INTRAVENOUS
  Filled 2015-05-10: qty 2

## 2015-05-10 MED ORDER — PROPOFOL 10 MG/ML IV BOLUS
INTRAVENOUS | Status: DC | PRN
  Administered 2015-05-10: 150 mg via INTRAVENOUS

## 2015-05-10 MED ORDER — HYDROCODONE-ACETAMINOPHEN 5-300 MG PO TABS: 1.0000 | ORAL_TABLET | ORAL | Status: DC | PRN

## 2015-05-10 MED ORDER — SUGAMMADEX SODIUM 500 MG/5ML IV SOLN
INTRAVENOUS | Status: DC | PRN
  Administered 2015-05-10: 239.6 mg via INTRAVENOUS

## 2015-05-10 MED ORDER — FENTANYL CITRATE (PF) 100 MCG/2ML IJ SOLN
25.0000 ug | INTRAMUSCULAR | Status: AC | PRN
  Administered 2015-05-10 (×6): 25 ug via INTRAVENOUS

## 2015-05-10 MED ORDER — DEXTROSE 5 % IV SOLN
3.0000 g | INTRAVENOUS | Status: AC
  Administered 2015-05-10: 3 g via INTRAVENOUS
  Filled 2015-05-10: qty 3000

## 2015-05-10 MED ORDER — LACTATED RINGERS IV SOLN
INTRAVENOUS | Status: DC
  Administered 2015-05-10: 50 mL/h via INTRAVENOUS

## 2015-05-10 MED ORDER — BUPIVACAINE-EPINEPHRINE (PF) 0.25% -1:200000 IJ SOLN
INTRAMUSCULAR | Status: AC
  Filled 2015-05-10: qty 30

## 2015-05-10 MED ORDER — KETOROLAC TROMETHAMINE 30 MG/ML IJ SOLN
INTRAMUSCULAR | Status: DC | PRN
  Administered 2015-05-10: 30 mg via INTRAVENOUS

## 2015-05-10 MED ORDER — FENTANYL CITRATE (PF) 100 MCG/2ML IJ SOLN
INTRAMUSCULAR | Status: DC | PRN
  Administered 2015-05-10: 50 ug via INTRAVENOUS
  Administered 2015-05-10: 150 ug via INTRAVENOUS
  Administered 2015-05-10: 50 ug via INTRAVENOUS

## 2015-05-10 MED ORDER — ROCURONIUM BROMIDE 100 MG/10ML IV SOLN
INTRAVENOUS | Status: DC | PRN
  Administered 2015-05-10: 40 mg via INTRAVENOUS
  Administered 2015-05-10: 10 mg via INTRAVENOUS

## 2015-05-10 MED ORDER — FAMOTIDINE 20 MG PO TABS
ORAL_TABLET | ORAL | Status: AC
  Administered 2015-05-10: 20 mg via ORAL
  Filled 2015-05-10: qty 1

## 2015-05-10 MED ORDER — DEXAMETHASONE SODIUM PHOSPHATE 10 MG/ML IJ SOLN
INTRAMUSCULAR | Status: DC | PRN
  Administered 2015-05-10: 10 mg via INTRAVENOUS

## 2015-05-10 MED ORDER — HEPARIN SODIUM (PORCINE) 5000 UNIT/ML IJ SOLN
INTRAMUSCULAR | Status: AC
  Administered 2015-05-10: 5000 [IU] via SUBCUTANEOUS
  Filled 2015-05-10: qty 1

## 2015-05-10 MED ORDER — CHLORHEXIDINE GLUCONATE 4 % EX LIQD: 1.0000 "application " | Freq: Once | CUTANEOUS | Status: DC

## 2015-05-10 SURGICAL SUPPLY — 43 items
ADHESIVE MASTISOL STRL (MISCELLANEOUS) ×3 IMPLANT
APPLIER CLIP ROT 10 11.4 M/L (STAPLE) ×3
BLADE SURG SZ11 CARB STEEL (BLADE) ×3 IMPLANT
CANISTER SUCT 1200ML W/VALVE (MISCELLANEOUS) ×3 IMPLANT
CATH CHOLANGI 4FR 420404F (CATHETERS) IMPLANT
CHLORAPREP W/TINT 26ML (MISCELLANEOUS) ×3 IMPLANT
CLIP APPLIE ROT 10 11.4 M/L (STAPLE) ×1 IMPLANT
CLOSURE WOUND 1/2 X4 (GAUZE/BANDAGES/DRESSINGS) ×1
CONRAY 60ML FOR OR (MISCELLANEOUS) IMPLANT
DRAPE C-ARM XRAY 36X54 (DRAPES) IMPLANT
ELECT REM PT RETURN 9FT ADLT (ELECTROSURGICAL) ×3
ELECTRODE REM PT RTRN 9FT ADLT (ELECTROSURGICAL) ×1 IMPLANT
ENDOPOUCH RETRIEVER 10 (MISCELLANEOUS) ×3 IMPLANT
GAUZE SPONGE NON-WVN 2X2 STRL (MISCELLANEOUS) ×4 IMPLANT
GLOVE BIO SURGEON STRL SZ8 (GLOVE) ×9 IMPLANT
GOWN STRL REUS W/ TWL LRG LVL3 (GOWN DISPOSABLE) ×4 IMPLANT
GOWN STRL REUS W/TWL LRG LVL3 (GOWN DISPOSABLE) ×8
IRRIGATION STRYKERFLOW (MISCELLANEOUS) IMPLANT
IRRIGATOR STRYKERFLOW (MISCELLANEOUS)
IV CATH ANGIO 12GX3 LT BLUE (NEEDLE) IMPLANT
IV NS 1000ML (IV SOLUTION)
IV NS 1000ML BAXH (IV SOLUTION) IMPLANT
JACKSON PRATT 10 (INSTRUMENTS) IMPLANT
KIT RM TURNOVER STRD PROC AR (KITS) ×3 IMPLANT
LABEL OR SOLS (LABEL) ×3 IMPLANT
NDL SAFETY 22GX1.5 (NEEDLE) ×3 IMPLANT
NEEDLE VERESS 14GA 120MM (NEEDLE) ×3 IMPLANT
NS IRRIG 500ML POUR BTL (IV SOLUTION) ×3 IMPLANT
PACK LAP CHOLECYSTECTOMY (MISCELLANEOUS) ×3 IMPLANT
SCISSORS METZENBAUM CVD 33 (INSTRUMENTS) ×3 IMPLANT
SLEEVE ENDOPATH XCEL 5M (ENDOMECHANICALS) ×6 IMPLANT
SPONGE EXCIL AMD DRAIN 4X4 6P (MISCELLANEOUS) IMPLANT
SPONGE LAP 18X18 5 PK (GAUZE/BANDAGES/DRESSINGS) ×3 IMPLANT
SPONGE VERSALON 2X2 STRL (MISCELLANEOUS) ×8
STRIP CLOSURE SKIN 1/2X4 (GAUZE/BANDAGES/DRESSINGS) ×2 IMPLANT
SUT MNCRL 4-0 (SUTURE) ×4
SUT MNCRL 4-0 27XMFL (SUTURE) ×2
SUT VICRYL 0 AB UR-6 (SUTURE) ×3 IMPLANT
SUTURE MNCRL 4-0 27XMF (SUTURE) ×2 IMPLANT
SYR 20CC LL (SYRINGE) ×3 IMPLANT
TROCAR XCEL NON-BLD 11X100MML (ENDOMECHANICALS) ×3 IMPLANT
TROCAR XCEL NON-BLD 5MMX100MML (ENDOMECHANICALS) ×3 IMPLANT
TUBING INSUFFLATOR HI FLOW (MISCELLANEOUS) ×3 IMPLANT

## 2015-05-10 NOTE — Transfer of Care (Signed)
Immediate Anesthesia Transfer of Care Note  Patient: Ashley Deleon  Procedure(s) Performed: Procedure(s): LAPAROSCOPIC CHOLECYSTECTOMY WITH INTRAOPERATIVE CHOLANGIOGRAM (N/A)  Patient Location: PACU  Anesthesia Type:General  Level of Consciousness: patient cooperative and lethargic  Airway & Oxygen Therapy: Patient Spontanous Breathing and Patient connected to face mask oxygen  Post-op Assessment: Post -op Vital signs reviewed and stable  Post vital signs: Reviewed and stable  Last Vitals:  Filed Vitals:   05/10/15 0612 05/10/15 0822  BP: 125/82 139/73  Pulse: 77 86  Temp: 36.7 C 36.5 C  Resp: 16 20    Complications: No apparent anesthesia complications

## 2015-05-10 NOTE — Anesthesia Postprocedure Evaluation (Signed)
Anesthesia Post Note  Patient: Tsering Lampkin  Procedure(s) Performed: Procedure(s) (LRB): LAPAROSCOPIC CHOLECYSTECTOMY WITH INTRAOPERATIVE CHOLANGIOGRAM (N/A)  Patient location during evaluation: PACU Anesthesia Type: General Level of consciousness: awake and alert Pain management: pain level controlled Vital Signs Assessment: post-procedure vital signs reviewed and stable Respiratory status: spontaneous breathing, nonlabored ventilation, respiratory function stable and patient connected to nasal cannula oxygen Cardiovascular status: blood pressure returned to baseline and stable Postop Assessment: no signs of nausea or vomiting Anesthetic complications: no    Last Vitals:  Filed Vitals:   05/10/15 0928 05/10/15 1030  BP: 109/62 106/60  Pulse: 80 65  Temp: 36.8 C 36.7 C  Resp: 16 16    Last Pain:  Filed Vitals:   05/10/15 1043  PainSc: 3                  Martha Clan

## 2015-05-10 NOTE — Progress Notes (Signed)
Preoperative Review   Patient is met in the preoperative holding area. The history is reviewed in the chart and with the patient. I personally reviewed the options and rationale as well as the risks of this procedure that have been previously discussed with the patient. All questions asked by the patient and/or family were answered to their satisfaction.  Patient agrees to proceed with this procedure at this time.  Eve Rey E Nefi Musich M.D. FACS  

## 2015-05-10 NOTE — Anesthesia Preprocedure Evaluation (Signed)
Anesthesia Evaluation  Patient identified by MRN, date of birth, ID band Patient awake    Reviewed: Allergy & Precautions, H&P , NPO status , Patient's Chart, lab work & pertinent test results, reviewed documented beta blocker date and time   History of Anesthesia Complications Negative for: history of anesthetic complications  Airway Mallampati: III  TM Distance: >3 FB Neck ROM: full    Dental no notable dental hx. (+) Missing, Poor Dentition   Pulmonary neg shortness of breath, neg sleep apnea, neg COPD, neg recent URI, former smoker,    Pulmonary exam normal breath sounds clear to auscultation       Cardiovascular Exercise Tolerance: Good negative cardio ROS Normal cardiovascular exam Rhythm:regular Rate:Normal     Neuro/Psych  Headaches, neg Seizures PSYCHIATRIC DISORDERS (Anxiety)    GI/Hepatic Neg liver ROS, GERD  ,  Endo/Other  neg diabetesMorbid obesity  Renal/GU Renal disease (kidney stones)  negative genitourinary   Musculoskeletal   Abdominal   Peds  Hematology negative hematology ROS (+)   Anesthesia Other Findings Past Medical History:   Gallstone                                                    Migraine                                                     Ovarian cyst                                                   Comment:left   Anxiety                                                      GERD (gastroesophageal reflux disease)                       Chronic kidney disease                                         Comment:H/O KIDNEY STONES   Anemia                                                         Comment:H/O   Bleeds easily (HCC)                                            Comment:BLED EASILY DURING LEEP PROCEDURE PER PT   Reproductive/Obstetrics negative OB ROS  Anesthesia Physical Anesthesia Plan  ASA: III  Anesthesia Plan:  General   Post-op Pain Management:    Induction:   Airway Management Planned:   Additional Equipment:   Intra-op Plan:   Post-operative Plan:   Informed Consent: I have reviewed the patients History and Physical, chart, labs and discussed the procedure including the risks, benefits and alternatives for the proposed anesthesia with the patient or authorized representative who has indicated his/her understanding and acceptance.   Dental Advisory Given  Plan Discussed with: Anesthesiologist, CRNA and Surgeon  Anesthesia Plan Comments:         Anesthesia Quick Evaluation

## 2015-05-10 NOTE — Anesthesia Procedure Notes (Signed)
Procedure Name: Intubation Date/Time: 05/10/2015 7:39 AM Performed by: Jonna Clark Pre-anesthesia Checklist: Patient identified, Patient being monitored, Timeout performed, Emergency Drugs available and Suction available Patient Re-evaluated:Patient Re-evaluated prior to inductionOxygen Delivery Method: Circle system utilized Preoxygenation: Pre-oxygenation with 100% oxygen Intubation Type: IV induction Ventilation: Mask ventilation without difficulty Laryngoscope Size: Mac and 3 Grade View: Grade I Tube type: Oral Tube size: 7.0 mm Number of attempts: 1 Placement Confirmation: ETT inserted through vocal cords under direct vision,  positive ETCO2 and breath sounds checked- equal and bilateral Secured at: 21 cm Tube secured with: Tape Dental Injury: Teeth and Oropharynx as per pre-operative assessment

## 2015-05-10 NOTE — Op Note (Signed)
Laparoscopic Cholecystectomy  Pre-operative Diagnosis: Biliary colic  Post-operative Diagnosis: Biliary colic  Procedure: Lap or O scopic cholecystectomy  Surgeon: Jerrol Banana. Burt Knack, MD FACS  Anesthesia: Gen. with endotracheal tube  Assistant:P A student  Procedure Details  The patient was seen again in the Holding Room. The benefits, complications, treatment options, and expected outcomes were discussed with the patient. The risks of bleeding, infection, recurrence of symptoms, failure to resolve symptoms, bile duct damage, bile duct leak, retained common bile duct stone, bowel injury, any of which could require further surgery and/or ERCP, stent, or papillotomy were reviewed with the patient. The likelihood of improving the patient's symptoms with return to their baseline status is good.  The patient and/or family concurred with the proposed plan, giving informed consent.  The patient was taken to Operating Room, identified as Ashley Deleon and the procedure verified as Laparoscopic Cholecystectomy.  A Time Out was held and the above information confirmed.  Prior to the induction of general anesthesia, antibiotic prophylaxis was administered. VTE prophylaxis was in place. General endotracheal anesthesia was then administered and tolerated well. After the induction, the abdomen was prepped with Chloraprep and draped in the sterile fashion. The patient was positioned in the supine position.  Local anesthetic  was injected into the skin near the umbilicus and an incision made. The Veress needle was placed. Pneumoperitoneum was then created with CO2 and tolerated well without any adverse changes in the patient's vital signs. A 45mm port was placed in the periumbilical position and the abdominal cavity was explored.  Two 5-mm ports were placed in the right upper quadrant and a 12 mm epigastric port was placed all under direct vision. All skin incisions  were infiltrated with a local anesthetic agent  before making the incision and placing the trocars.   The patient was positioned  in reverse Trendelenburg, tilted slightly to the patient's left.  The gallbladder was identified, the fundus grasped and retracted cephalad. Adhesions were lysed bluntly. The infundibulum was grasped and retracted laterally, exposing the peritoneum overlying the triangle of Calot. This was then divided and exposed in a blunt fashion. A critical view of the cystic duct and cystic artery was obtained.  The cystic duct was clearly identified and bluntly dissected.   A branch of the cystic artery was doubly clipped and divided this allowed for good visualization of the cystic duct as it entered the infundibulum of the gallbladder. Here it was doubly clipped and divided a second branch of the cystic artery was doubly clipped and divided.  The gallbladder was taken from the gallbladder fossa in a retrograde fashion with the electrocautery. The gallbladder was removed and placed in an Endocatch bag. The liver bed was irrigated and inspected. Hemostasis was achieved with the electrocautery. Copious irrigation was utilized and was repeatedly aspirated until clear.  The gallbladder and Endocatch sac were then removed through the epigastric port site.   Inspection of the right upper quadrant was performed. No bleeding, bile duct injury or leak, or bowel injury was noted. Pneumoperitoneum was released.  The epigastric port site was closed with figure-of-eight 0 Vicryl sutures. 4-0 subcuticular Monocryl was used to close the skin. Steristrips and Mastisol and sterile dressings were  applied.  The patient was then extubated and brought to the recovery room in stable condition. Sponge, lap, and needle counts were correct at closure and at the conclusion of the case.   Findings: Chronic Cholecystitis with gallstones Estimated Blood Loss: 25 cc  Drains: None         Specimens: Gallbladder           Complications: none                Ikey Omary E. Burt Knack, MD, FACS

## 2015-05-10 NOTE — Discharge Instructions (Signed)
Remove dressing in 24 hours. °May shower in 24 hours. °Leave paper strips in place. °Resume all home medications. °Follow-up with Dr. Cooper in 10 days. ° °AMBULATORY SURGERY  °DISCHARGE INSTRUCTIONS ° ° °1) The drugs that you were given will stay in your system until tomorrow so for the next 24 hours you should not: ° °A) Drive an automobile °B) Make any legal decisions °C) Drink any alcoholic beverage ° ° °2) You may resume regular meals tomorrow.  Today it is better to start with liquids and gradually work up to solid foods. ° °You may eat anything you prefer, but it is better to start with liquids, then soup and crackers, and gradually work up to solid foods. ° ° °3) Please notify your doctor immediately if you have any unusual bleeding, trouble breathing, redness and pain at the surgery site, drainage, fever, or pain not relieved by medication. ° ° ° °4) Additional Instructions: ° ° ° ° ° ° ° °Please contact your physician with any problems or Same Day Surgery at [phone], Monday through Friday 6 am to 4 pm, or [place] [facility] [place] Main number at [phone]. °

## 2015-05-11 LAB — SURGICAL PATHOLOGY

## 2015-05-13 ENCOUNTER — Telehealth: Payer: Self-pay | Admitting: General Surgery

## 2015-05-13 NOTE — Telephone Encounter (Signed)
Patient called and said you were suppose to call her back regarding [facility].

## 2015-05-13 NOTE — Telephone Encounter (Signed)
I have called patient back and patient was advised to call physician billing at [phone] to discuss refunds. Patient understands that we can not refund payment from a past visit.

## 2015-05-16 ENCOUNTER — Telehealth: Payer: Self-pay

## 2015-05-16 NOTE — Telephone Encounter (Signed)
Pt has an appt with Dr. Azalee Course on 05/19/15 for a post op lap chole. Pt has requested to move her appt due to having another appt that day. She would like to come to [facility] because it would be much easier on her. I advised her Dr. Burt Knack is there on Friday the 24th but his schedule is full. She would like a call back to see if she can be fit in that day. Please call pt. Thanks!

## 2015-05-16 NOTE — Telephone Encounter (Signed)
Returned phone call to patient. Patient wanted to see Dr. Burt Knack in the [facility] in the morning. Discussed this with patient. She decided that she would rather see someone else in the [facility] as long it was a morning appointment.   Appointment made for 3/8 with Dr. Dahlia Byes in [place].

## 2015-05-19 ENCOUNTER — Ambulatory Visit: Payer: BLUE CROSS/BLUE SHIELD | Admitting: Surgery

## 2015-06-01 ENCOUNTER — Ambulatory Visit: Payer: Self-pay | Admitting: Surgery

## 2015-06-08 ENCOUNTER — Encounter: Payer: Self-pay | Admitting: Surgery

## 2015-06-08 ENCOUNTER — Ambulatory Visit (INDEPENDENT_AMBULATORY_CARE_PROVIDER_SITE_OTHER): Payer: BLUE CROSS/BLUE SHIELD | Admitting: Surgery

## 2015-06-08 VITALS — BP 122/82 | HR 67 | Temp 98.6°F | Ht 69.0 in | Wt 255.6 lb

## 2015-06-08 DIAGNOSIS — Z09 Encounter for follow-up examination after completed treatment for conditions other than malignant neoplasm: Secondary | ICD-10-CM

## 2015-06-08 NOTE — Patient Instructions (Signed)
Please call with any questions or concerns.

## 2015-06-08 NOTE — Progress Notes (Signed)
s/post laparoscopic cholecystectomy by Dr. Burt Knack. She is doing very well, no major complaints. She is tolerating by mouth and having bowel movements. She is afebrile  PE: No acute distress awake alert Abdomen: Soft, nontender, incisions clean dry and intact no evidence of infection. Very minimal erythema in the umbilical port.  A/P Status post laparoscopic cholecystectomy doing well. Pathology discussed with the patient as well as postoperative course. No heavy lifting. Return to clinic when necessary

## 2015-08-25 ENCOUNTER — Telehealth: Payer: Self-pay | Admitting: Surgery

## 2015-08-25 NOTE — Telephone Encounter (Signed)
Spoke with Patient regarding her lower right side pain. She was instructed to call her PCP provider for the pain due to it not being related to her surgery back in February.Patient verbalized understanding.

## 2015-08-25 NOTE — Telephone Encounter (Signed)
Patient had LAPAROSCOPIC CHOLECYSTECTOMY  with Dr Burt Knack on 05/10/15. She is now having pain on her lower right side and is concerned it may have something to do with her surgery. She would like to speak with the nurse.

## 2015-08-31 ENCOUNTER — Other Ambulatory Visit: Payer: Self-pay | Admitting: Obstetrics and Gynecology

## 2015-08-31 DIAGNOSIS — R102 Pelvic and perineal pain: Secondary | ICD-10-CM

## 2015-09-02 ENCOUNTER — Ambulatory Visit: Payer: BLUE CROSS/BLUE SHIELD

## 2019-02-27 ENCOUNTER — Telehealth: Payer: Self-pay | Admitting: Obstetrics & Gynecology

## 2019-02-27 NOTE — Telephone Encounter (Signed)
There have been several attempts to reach this patient in reguards to scheduling the requested referral. The attempts have been unsuccessful. Please notify your referral coordinator and medical records department. If I may be anymore of assistance please let me know. Thank you for Choosing [facility] for you patients. ° °Sara Paschal ° °

## 2019-03-30 ENCOUNTER — Other Ambulatory Visit: Payer: Self-pay

## 2019-03-30 ENCOUNTER — Other Ambulatory Visit: Payer: Self-pay | Admitting: *Deleted

## 2019-03-30 DIAGNOSIS — Z0000 Encounter for general adult medical examination without abnormal findings: Secondary | ICD-10-CM

## 2019-03-31 ENCOUNTER — Other Ambulatory Visit (HOSPITAL_COMMUNITY)
Admission: RE | Admit: 2019-03-31 | Discharge: 2019-03-31 | Disposition: A | Discharge status: Home / Self Care | Payer: Medicaid Other | Source: Ambulatory Visit | Attending: Obstetrics & Gynecology | Admitting: Obstetrics & Gynecology

## 2019-03-31 ENCOUNTER — Ambulatory Visit
Admission: RE | Admit: 2019-03-31 | Discharge: 2019-03-31 | Disposition: A | Discharge status: Home / Self Care | Payer: Medicaid Other | Source: Ambulatory Visit | Attending: Oncology | Admitting: Oncology

## 2019-03-31 ENCOUNTER — Ambulatory Visit: Payer: Self-pay | Attending: Oncology | Admitting: *Deleted

## 2019-03-31 ENCOUNTER — Other Ambulatory Visit: Payer: Self-pay

## 2019-03-31 ENCOUNTER — Encounter: Payer: Self-pay | Admitting: *Deleted

## 2019-03-31 ENCOUNTER — Ambulatory Visit (INDEPENDENT_AMBULATORY_CARE_PROVIDER_SITE_OTHER): Payer: Self-pay | Admitting: Obstetrics & Gynecology

## 2019-03-31 ENCOUNTER — Encounter: Payer: Self-pay | Admitting: Obstetrics & Gynecology

## 2019-03-31 VITALS — BP 120/80 | Ht 69.0 in | Wt 246.0 lb

## 2019-03-31 DIAGNOSIS — Z0000 Encounter for general adult medical examination without abnormal findings: Secondary | ICD-10-CM | POA: Insufficient documentation

## 2019-03-31 DIAGNOSIS — R87611 Atypical squamous cells cannot exclude high grade squamous intraepithelial lesion on cytologic smear of cervix (ASC-H): Secondary | ICD-10-CM

## 2019-03-31 DIAGNOSIS — R87610 Atypical squamous cells of undetermined significance on cytologic smear of cervix (ASC-US): Secondary | ICD-10-CM

## 2019-03-31 DIAGNOSIS — D260 Other benign neoplasm of cervix uteri: Secondary | ICD-10-CM | POA: Insufficient documentation

## 2019-03-31 DIAGNOSIS — N63 Unspecified lump in unspecified breast: Secondary | ICD-10-CM

## 2019-03-31 NOTE — Progress Notes (Signed)
Referring Provider:  [facility]  HPI:  Ashley Deleon is a 49 y.o.  [id number]  who presents today for evaluation and management of abnormal cervical cytology.    Dysplasia History:  ASC-H by 02/2019 PAP Prior LEEP 2013 Normal PAP on intermitttant basis since that time  ROS:  Pertinent items are noted in HPI.  OB History  Gravida Para Term Preterm AB Living  7 5 5   2 5   SAB TAB Ectopic Multiple Live Births  2            # Outcome Date GA Lbr Len/2nd Weight Sex Delivery Anes PTL Lv  7 SAB           6 SAB           5 Term           4 Term           3 Term           2 Term           1 Term             Past Medical History:  Diagnosis Date   Anemia    H/O   Anxiety    Bleeds easily ([place])    BLED EASILY DURING LEEP PROCEDURE PER PT   Chronic kidney disease    H/O KIDNEY STONES   Gallstone    GERD (gastroesophageal reflux disease)    Migraine    Ovarian cyst    left    Past Surgical History:  Procedure Laterality Date   APPENDECTOMY  2013   Dr. Pat Patrick   CHOLECYSTECTOMY N/A 05/10/2015   Procedure: LAPAROSCOPIC CHOLECYSTECTOMY WITH INTRAOPERATIVE CHOLANGIOGRAM;  Surgeon: Florene Glen, MD;  Location: [facility] ORS;  Service: General;  Laterality: N/A;   LEEP  2013   [facility]    SOCIAL HISTORY: Social History   Substance and Sexual Activity  Alcohol Use No   Social History   Substance and Sexual Activity  Drug Use No     Family History  Problem Relation Age of Onset   Gallstones Brother     ALLERGIES:  Patient has no known allergies.  Current Outpatient Medications on File Prior to Visit  Medication Sig Dispense Refill   etonogestrel (IMPLANON) 68 MG IMPL implant 1 each by Subdermal route once.     acetaminophen (TYLENOL) 500 MG tablet Take 500 mg by mouth every 6 (six) hours as needed.     Butalbital-APAP-Caffeine (FIORICET PO) Take 1 tablet by mouth as needed (Migraines).     No current facility-administered medications on file  prior to visit.    Physical Exam: -Vitals:  BP 120/80    Ht 5\' 9"  (1.753 m)    Wt 246 lb (111.6 kg)    BMI 36.33 kg/m  GEN: WD, WN, NAD.  A+ O x 3, good mood and affect. ABD:  NT, ND.  Soft, no masses.  No hernias noted.   Pelvic:   Vulva: Normal appearance.  No lesions.  Vagina: No lesions or abnormalities noted.  Support: Normal pelvic support.  Urethra No masses tenderness or scarring.  Meatus Normal size without lesions or prolapse.  Cervix: See below.  Anus: Normal exam.  No lesions.  Perineum: Normal exam.  No lesions.        Bimanual   Uterus: Normal size.  Non-tender.  Mobile.  AV.  Adnexae: No masses.  Non-tender to palpation.  Cul-de-sac: Negative for abnormality.  PROCEDURE: 1.  Urine Pregnancy Test:  not done 2.  Colposcopy performed with 4% acetic acid after verbal consent obtained                                         -Aceto-white Lesions Location(s): none.              -Biopsy performed at 6 o'clock               -ECC indicated and performed: Yes.       -Biopsy sites made hemostatic with pressure, AgNO3, and/or Monsel's solution   -Satisfactory colposcopy: Yes.      -Evidence of Invasive cervical CA :  NO  ASSESSMENT:  Ashley Deleon is a 49 y.o. [id number] here for  1. Atypical squamous cells cannot exclude high grade squamous intraepithelial lesion on cytologic smear of cervix (ASC-H)   .  PLAN: 1.  I discussed the grading system of pap smears and HPV high risk viral types.  We will discuss and base management after colpo results return. 2. Follow up PAP 6 months, vs intervention if high grade dysplasia identified 3. Treatment of persistantly abnormal PAP smears and cervical dysplasia, even mild, is discussed w pt today in detail, as well as the pros and cons of Cryo and LEEP procedures. Will consider and discuss after results.      Barnett Applebaum, MD, [facility], [facility] 03/31/2019  11:27 AM

## 2019-03-31 NOTE — Progress Notes (Signed)
Patient referred to [facility] by [facility] for financial assistance for further evaluation of recent abnormal pap of HPV positive ASCUS-H.  There is no pap for review.  Due to Covid 19 pandemic a televisit was used to enroll patient into our BCCCP program and complete her health history.  Verbal consent given.  Patient is scheduled to see Dr. Kenton Kingfisher today at [facility] for colposcopy and possible biopsy.  Since she is 20, she is also scheduled for a screening mammogram today since she denies any breast problems.  Will follow up per BCCCP protocol.  See gail breast cancer risk assessment below.   Risk Assessment    No risk assessment data for the current encounter   Risk Scores      03/30/2019   Last edited by: Orson Slick, CMA   5-year risk: 0.8 %   Lifetime risk: 8.5 %

## 2019-03-31 NOTE — Patient Instructions (Signed)

## 2019-03-31 NOTE — Addendum Note (Signed)
Addended by: Gae Dry on: 03/31/2019 01:53 PM   Modules accepted: Orders

## 2019-04-03 LAB — SURGICAL PATHOLOGY

## 2019-04-03 NOTE — Progress Notes (Signed)
PAP appt 6 mos

## 2019-04-10 ENCOUNTER — Ambulatory Visit
Admission: RE | Admit: 2019-04-10 | Discharge: 2019-04-10 | Disposition: A | Discharge status: Home / Self Care | Payer: Self-pay | Source: Ambulatory Visit | Attending: Oncology | Admitting: Oncology

## 2019-04-10 DIAGNOSIS — N63 Unspecified lump in unspecified breast: Secondary | ICD-10-CM

## 2019-04-16 ENCOUNTER — Other Ambulatory Visit: Payer: Self-pay | Admitting: *Deleted

## 2019-04-16 DIAGNOSIS — N6489 Other specified disorders of breast: Secondary | ICD-10-CM

## 2019-04-23 ENCOUNTER — Telehealth: Payer: Self-pay

## 2019-04-23 NOTE — Telephone Encounter (Signed)
Pt calling; had colpo on the 5th; has had a d/c ever since; kind of like a yeast inf; didn't have it before the colpl.  [phone]  Pt states it started out clear and now yellowish and has had an odor since it started.  It does irritate and is itchy.  Adv monistat and if no better to call to be seen and not to use anything down there the night before appt.

## 2019-04-29 ENCOUNTER — Encounter: Payer: Self-pay | Admitting: *Deleted

## 2019-04-29 NOTE — Telephone Encounter (Signed)
Pt calling; has had d/c since colpo; adv to use 3 or 7d tx otc; is no better.  [phone]

## 2019-04-29 NOTE — Progress Notes (Signed)
Patient called today and states she has been having a discharge since her colposcopy on 03/31/19.  Patient states she does not feel like this is normal.  States she tried an OTC med for yeast infection, but thinks it may be worse.  States the discharge is yellow and odorous.  Patient is scheduled to follow up with Dr. Kenton Kingfisher on Februray 7, 2021 for further evaluation.  She is aware she will follow up with Dr. Kenton Kingfisher in 6 months post her colposcopy also.

## 2019-04-29 NOTE — Telephone Encounter (Signed)
Spoke to patient and offered appointment  with Dr. Kenton Kingfisher for Thursday, 04/30/19, Let patient know we aren't sure if BCCCP will cover appointment and that Medicaid family planning waiver will not cover appointment and she may receive a bill. Patient decline appointment.

## 2019-04-29 NOTE — Telephone Encounter (Signed)
Appt

## 2019-04-29 NOTE — Telephone Encounter (Signed)
Spoke with Sheena, patient scheduled 2/4 with [facility], BCCCP flag.

## 2019-04-30 ENCOUNTER — Other Ambulatory Visit: Payer: Self-pay

## 2019-04-30 ENCOUNTER — Ambulatory Visit (INDEPENDENT_AMBULATORY_CARE_PROVIDER_SITE_OTHER): Payer: Self-pay | Admitting: Obstetrics & Gynecology

## 2019-04-30 ENCOUNTER — Encounter: Payer: Self-pay | Admitting: Obstetrics & Gynecology

## 2019-04-30 VITALS — BP 100/60 | Ht 69.0 in | Wt 247.0 lb

## 2019-04-30 DIAGNOSIS — N898 Other specified noninflammatory disorders of vagina: Secondary | ICD-10-CM

## 2019-04-30 NOTE — Progress Notes (Signed)
HPI:      [name] is a 49 y.o. [id number] who LMP was No LMP recorded. Patient has had an implant., presents today for a problem visit.  She complains of:  Vaginitis: Patient complains of an abnormal vaginal discharge for 4 weeks.  This has been since the procedure for colposcopy and biopsies related to her ASCUS PAP and the biopsies ended up being normal.  Vaginal symptoms include burning.Vulvar symptoms include none.STI Risk: Very low risk of STD exposureDischarge described as: yellow, bloody and malodorous.Other associated symptoms: none.Menstrual pattern: She had been bleeding irregularly. Contraception: Nexplanon  PMHx: She  has a past medical history of Anemia, Anxiety, Bleeds easily ([place]), Chronic kidney disease, Gallstone, GERD (gastroesophageal reflux disease), Migraine, and Ovarian cyst. Also,  has a past surgical history that includes Appendectomy (2013); LEEP (2013); and Cholecystectomy (N/A, 05/10/2015)., family history includes Breast cancer (age of onset: 68) in an other family member; Gallstones in her brother.,  reports that she quit smoking about 21 years ago. She has never used smokeless tobacco. She reports that she does not drink alcohol or use drugs.  She has a current medication list which includes the following prescription(s): etonogestrel, acetaminophen, and butalbital-apap-caffeine. Also, has No Known Allergies.  Review of Systems  Constitutional: Negative for chills, fever and malaise/fatigue.  HENT: Negative for congestion, sinus pain and sore throat.   Eyes: Negative for blurred vision and pain.  Respiratory: Negative for cough and wheezing.   Cardiovascular: Negative for chest pain and leg swelling.  Gastrointestinal: Negative for abdominal pain, constipation, diarrhea, heartburn, nausea and vomiting.  Genitourinary: Negative for dysuria, frequency, hematuria and urgency.  Musculoskeletal: Negative for back pain, joint pain, myalgias and neck pain.  Skin:  Negative for itching and rash.  Neurological: Negative for dizziness, tremors and weakness.  Endo/Heme/Allergies: Does not bruise/bleed easily.  Psychiatric/Behavioral: Negative for depression. The patient is not nervous/anxious and does not have insomnia.   All other systems reviewed and are negative.   Objective: BP 100/60   Ht 5\' 9"  (1.753 m)   Wt 247 lb (112 kg)   BMI 36.48 kg/m  Physical Exam Constitutional:      General: She is not in acute distress.    Appearance: She is well-developed.  Genitourinary:     Pelvic exam was performed with patient supine.     Vagina and uterus normal.     No vaginal erythema or bleeding.     No cervical motion tenderness, discharge, polyp or nabothian cyst.     Uterus is mobile.     Uterus is not enlarged.     No uterine mass detected.    Uterus is midaxial.     No right or left adnexal mass present.     Right adnexa not tender.     Left adnexa not tender.     Genitourinary Comments: Cx healing w ith contact bleeding w qtip  HENT:     Head: Normocephalic and atraumatic.     Nose: Nose normal.  Abdominal:     General: There is no distension.     Palpations: Abdomen is soft.     Tenderness: There is no abdominal tenderness.  Musculoskeletal:        General: Normal range of motion.  Neurological:     Mental Status: She is alert and oriented to person, place, and time.     Cranial Nerves: No cranial nerve deficit.  Skin:    General: Skin is warm and dry.  Psychiatric:  Attention and Perception: Attention normal.        Mood and Affect: Mood and affect normal.        Speech: Speech normal.        Behavior: Behavior normal.        Thought Content: Thought content normal.        Judgment: Judgment normal.     ASSESSMENT/PLAN:    Problem List Items Addressed This Visit       Vaginal discharge    Related to healing from biopsies of cervix, no s/sx BV or other infection  Monitor for improvement or worsening    Ashley Applebaum, MD, [facility] 04/30/2019  2:25 PM

## 2019-05-21 ENCOUNTER — Encounter: Payer: Self-pay | Admitting: *Deleted

## 2019-05-21 NOTE — Progress Notes (Signed)
Letter mailed to inform patient of her 6 mont follow up mammogram appointment on 10/12/19 @ 11:00.  Dr. Doreene Adas office is to schedule patient's 6 month follow up pap smear per his request.

## 2019-10-01 ENCOUNTER — Ambulatory Visit: Payer: Medicaid Other | Admitting: Obstetrics & Gynecology

## 2019-10-12 ENCOUNTER — Other Ambulatory Visit: Payer: Medicaid Other

## 2021-04-02 IMAGING — MG DIGITAL SCREENING BILAT W/ TOMO W/ CAD
8 series · 8 of 24 positions shown · non-contrast
Comparison: Previous exam(s).

CLINICAL DATA: Screening.

EXAM:
DIGITAL SCREENING BILATERAL MAMMOGRAM WITH TOMO AND CAD

[L CC synth-2D]
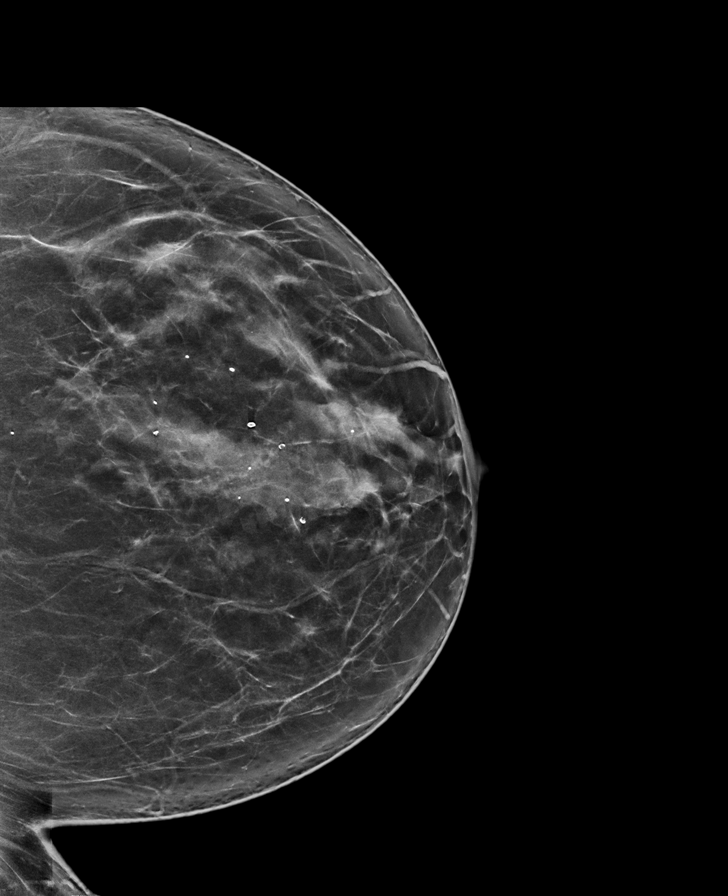

[R CC synth-2D]
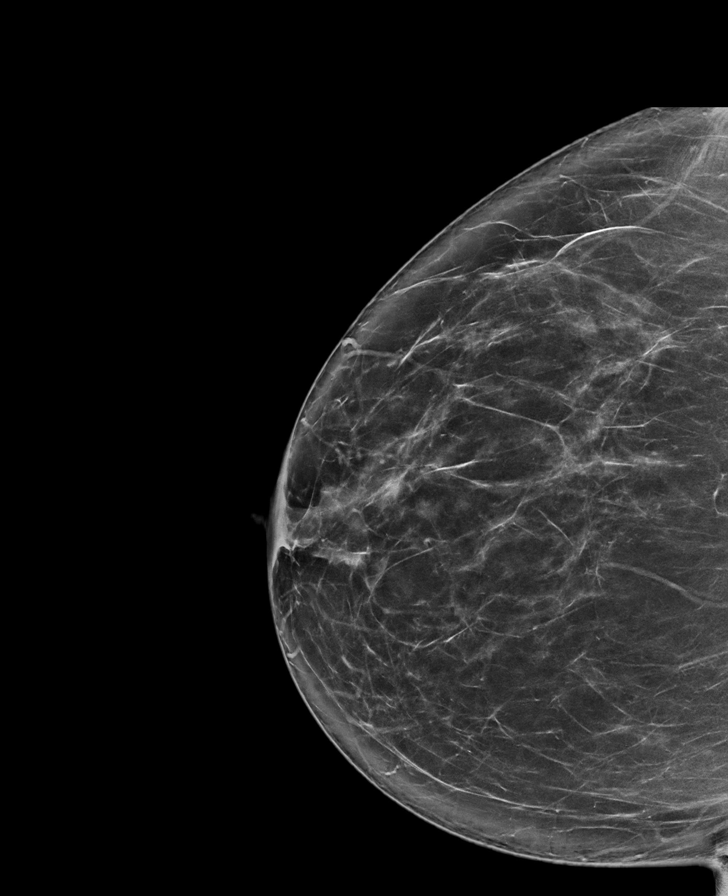

[L MLO synth-2D]
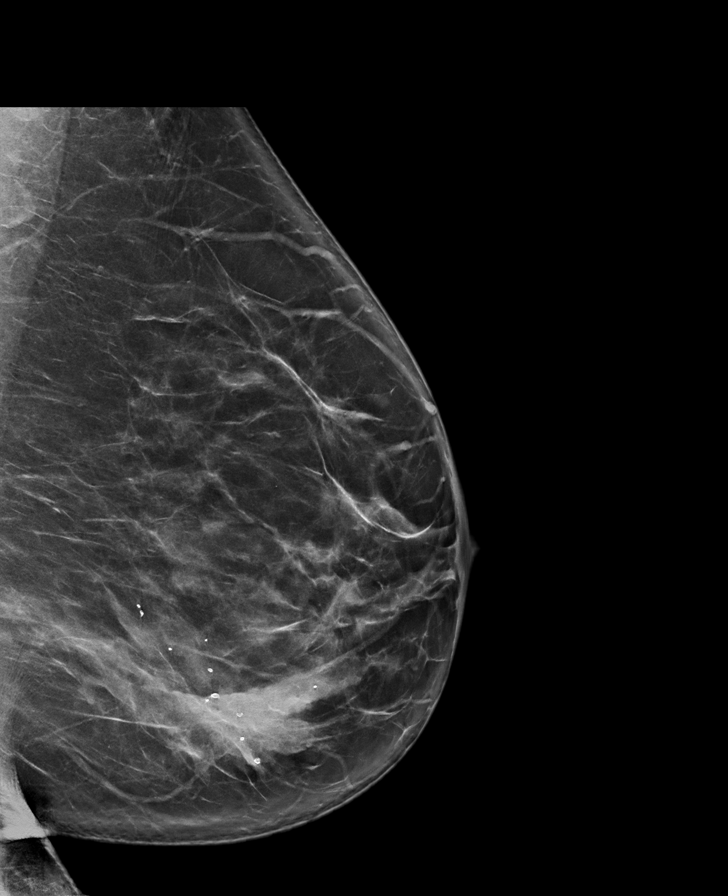

[R MLO synth-2D]
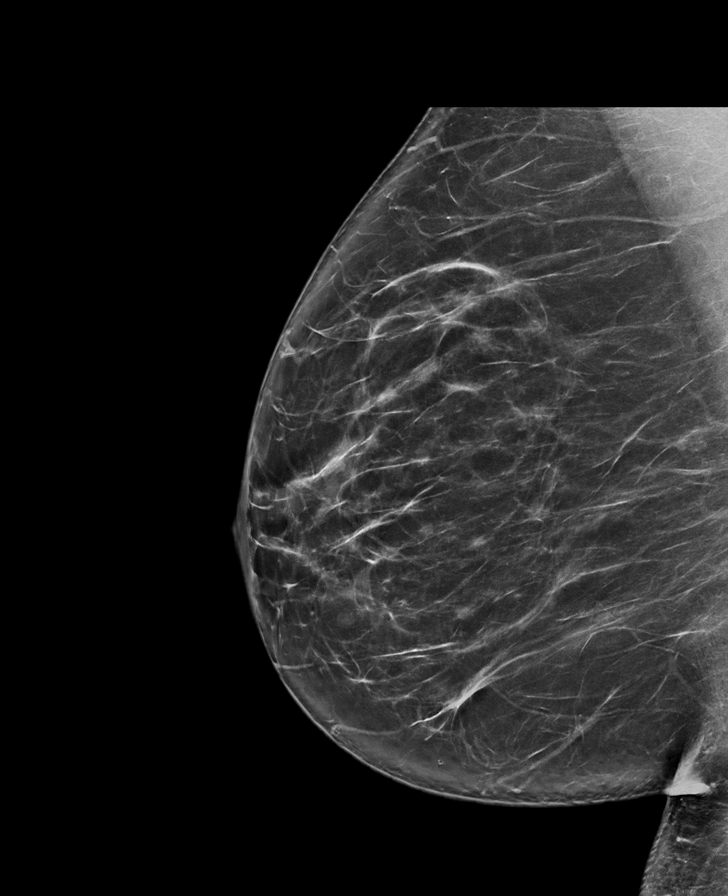

[L CC tomo · tomo slice 44/87.0]
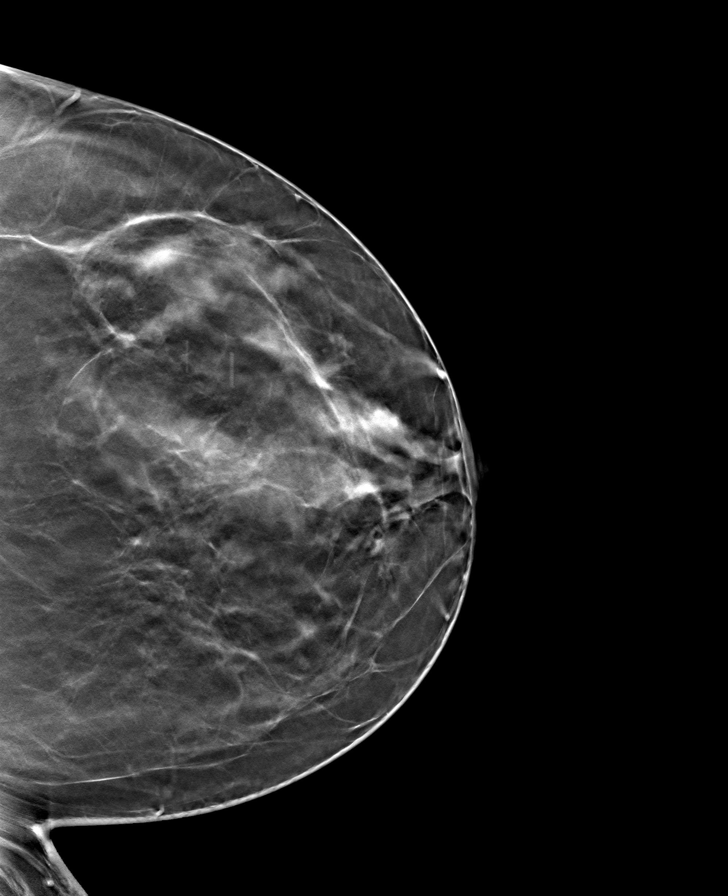

[L MLO tomo · tomo slice 49/96.0]
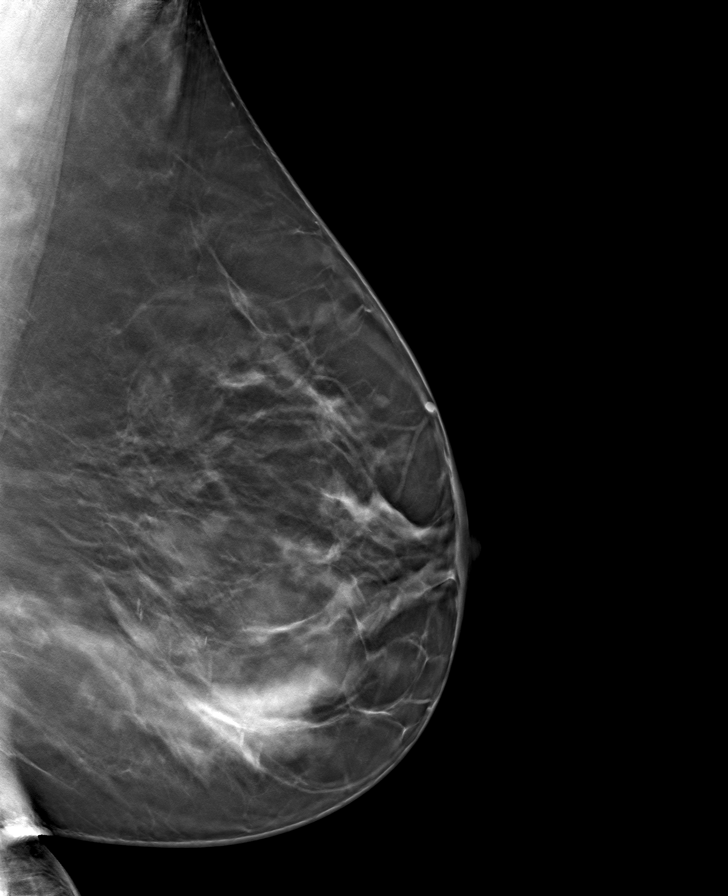

[R CC tomo · tomo slice 45/88.0]
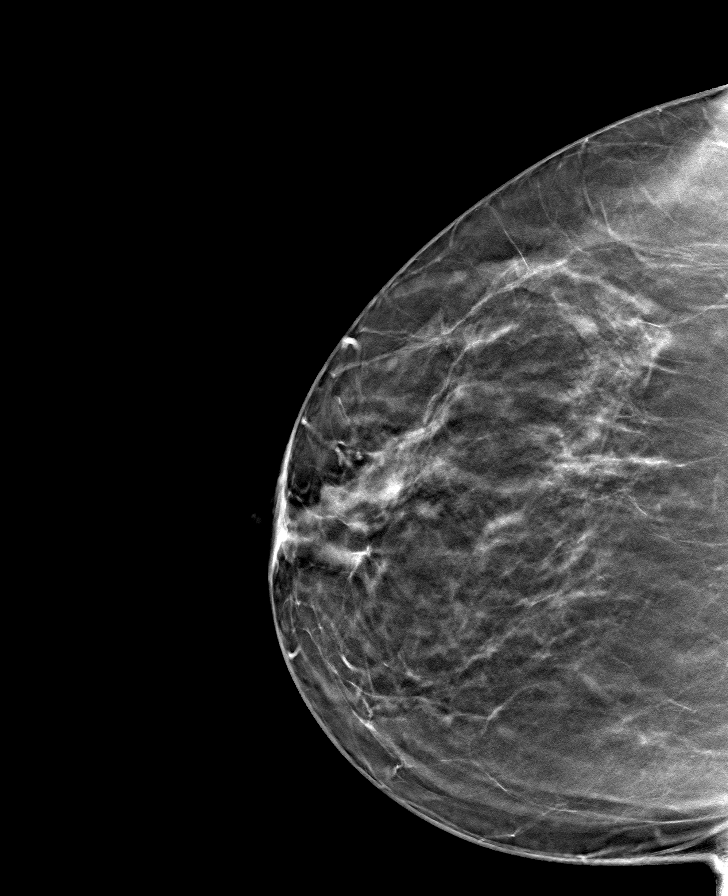

[R MLO tomo · tomo slice 47/94.0]
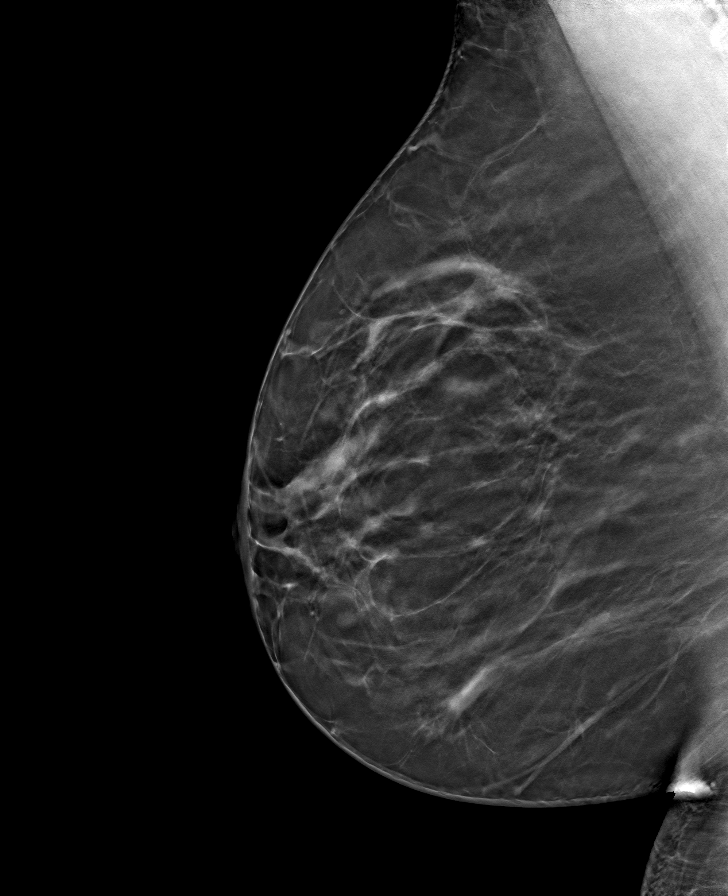

[8 of 24 positions shown; findings below may reference images not displayed]

ACR Breast Density Category c: The breast tissue is heterogeneously
dense, which may obscure small masses.
FINDINGS: In the left breast, a possible asymmetry warrants further
evaluation. In the right breast, no findings suspicious for
malignancy. Images were processed with CAD.
IMPRESSION: Further evaluation is suggested for possible asymmetry in the left
breast.

RECOMMENDATION:
Diagnostic mammogram and possibly ultrasound of the left breast.
(Code:[id number])

The patient will be contacted regarding the findings, and additional
imaging will be scheduled.

BI-RADS CATEGORY  0: Incomplete. Need additional imaging evaluation
and/or prior mammograms for comparison.

## 2021-04-12 IMAGING — US US BREAST*L* LIMITED INC AXILLA
1 series · 9 of 9 positions shown · non-contrast
Comparison: Previous exam(s).

CLINICAL DATA: Possible asymmetry in the outer left breast on a
recent screening mammogram. The patient was receiving hCG injections
for weight loss through February 2019. She also reports significant
caffeine intake.

EXAM:
DIGITAL DIAGNOSTIC LEFT MAMMOGRAM WITH CAD AND TOMO
ULTRASOUND LEFT BREAST

[Series 1: us breast*left* limited inc axilla · 0.08mm/px · 9 of 9 slices shown]
[im 1/9]
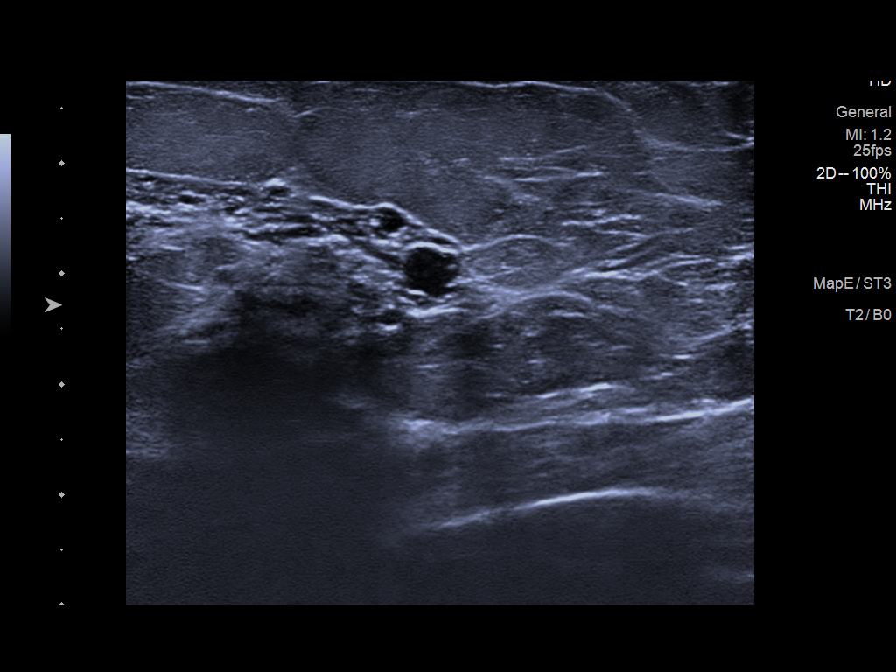
[im 2/9]
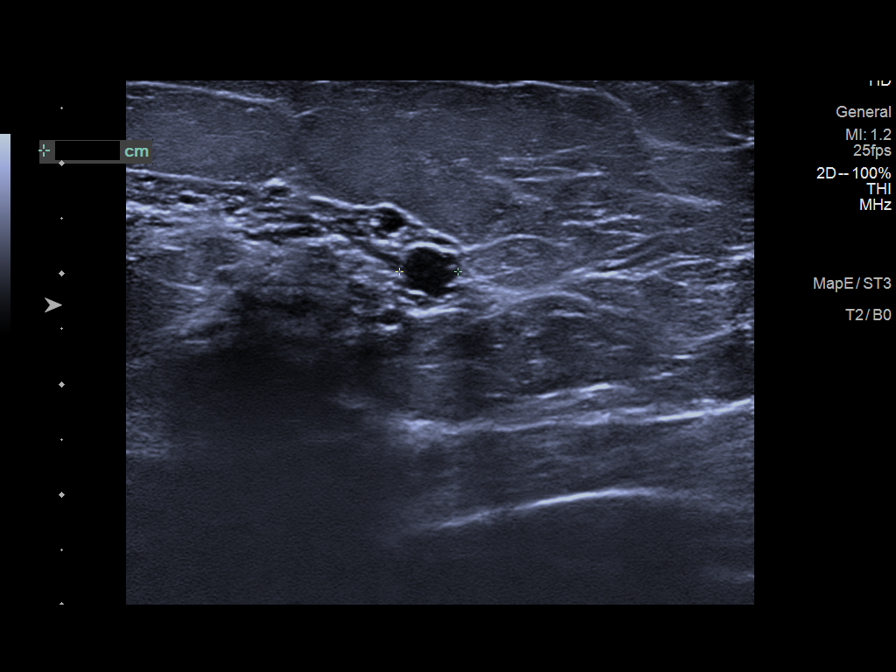
[im 3/9]
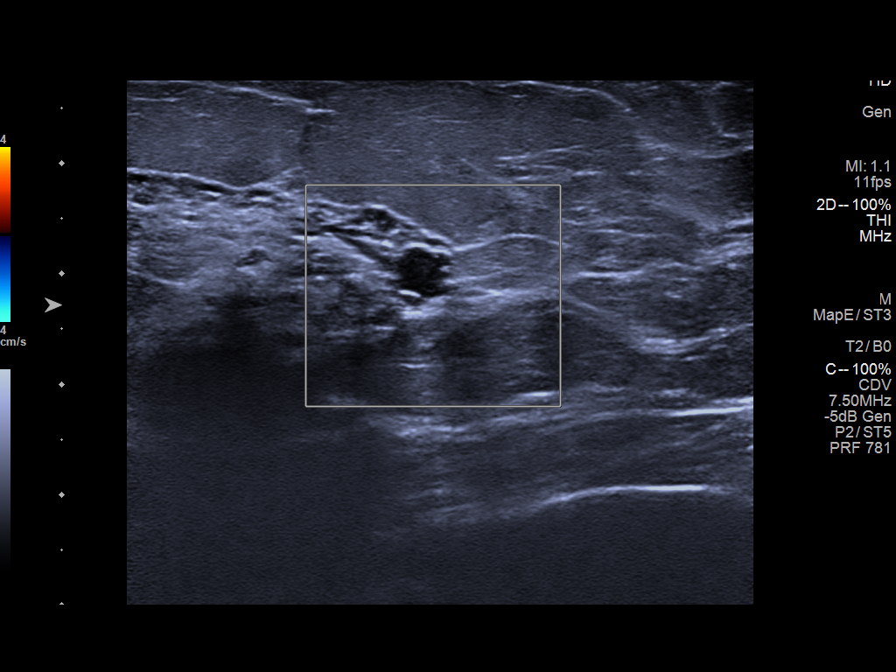
[im 4/9]
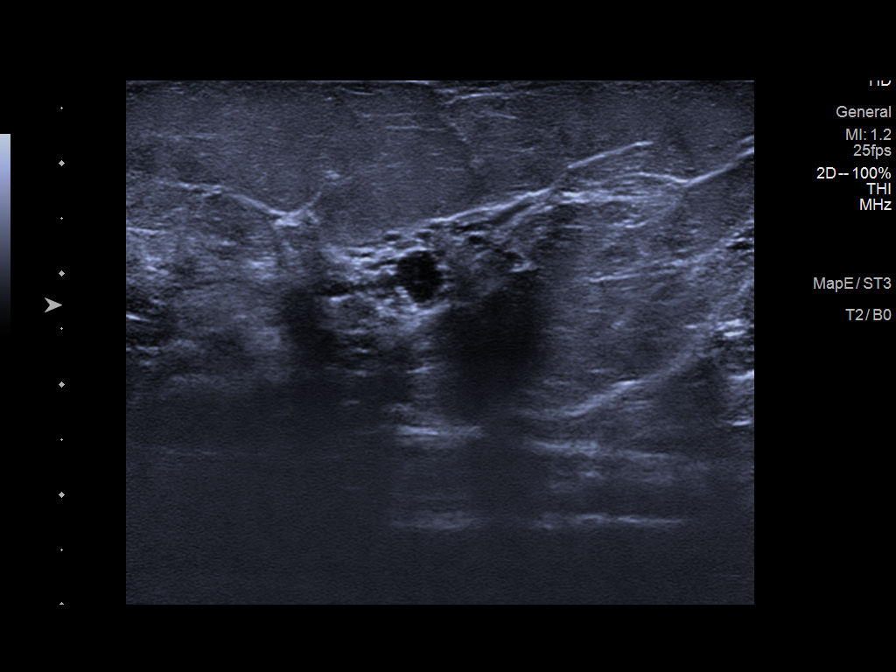
[im 5/9]
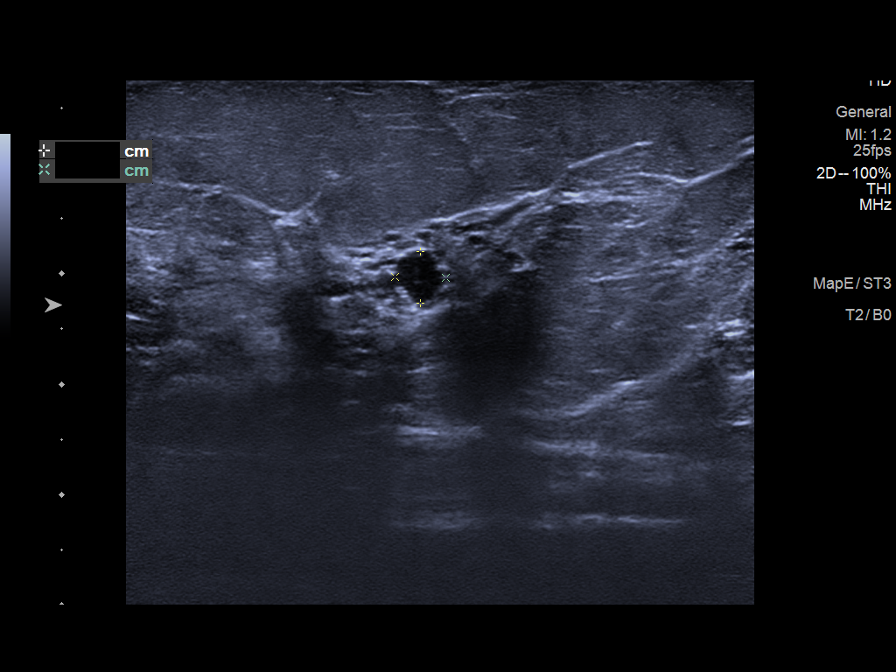
[im 6/9]
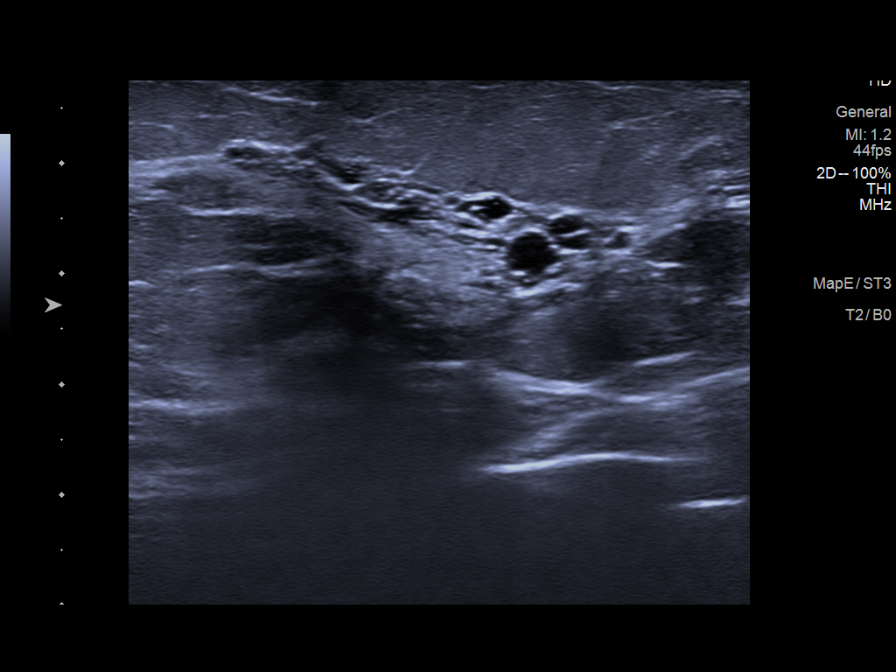
[im 7/9]
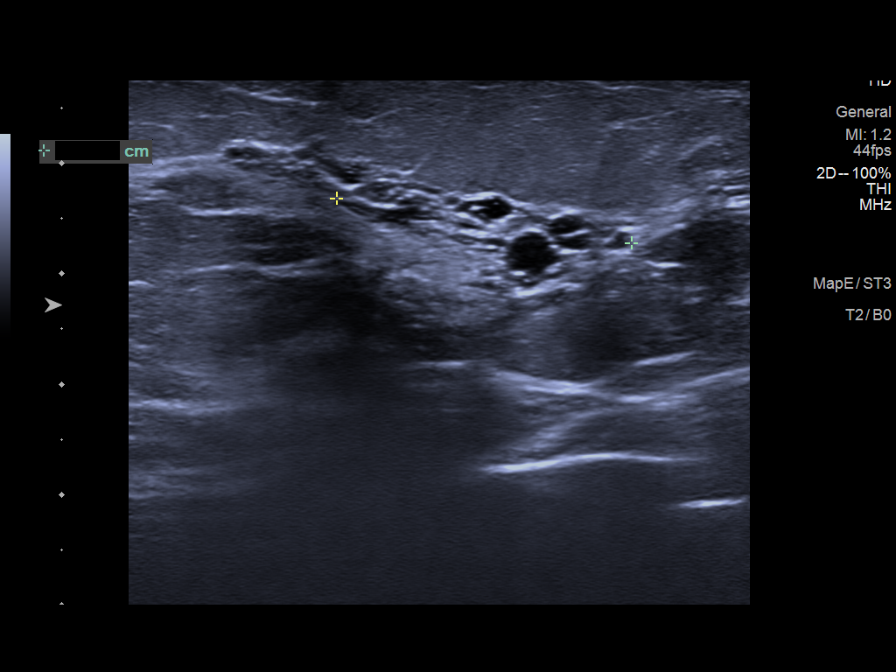
[im 8/9]
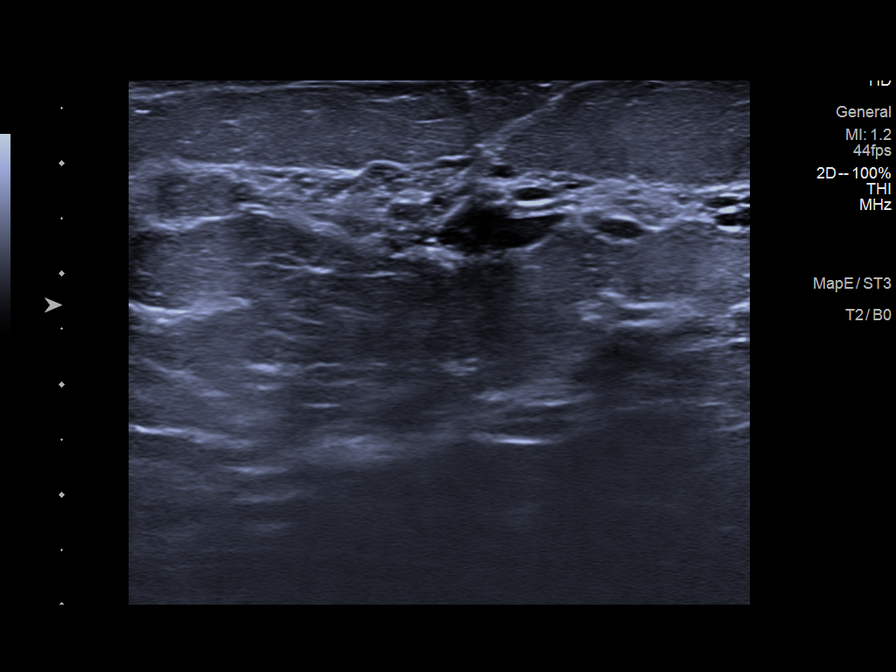
[im 9/9]
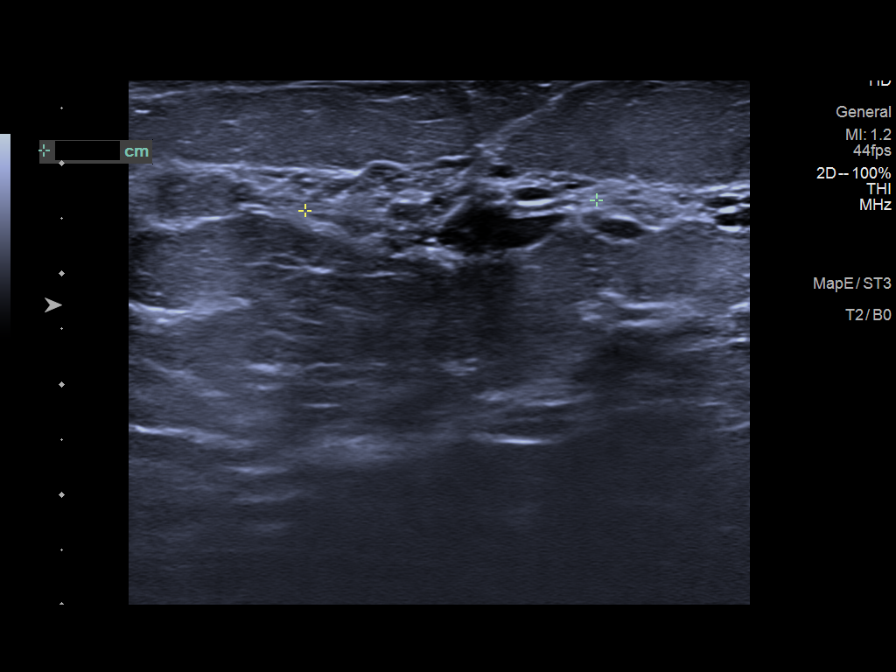

[9 of 9 positions shown; findings below may reference images not displayed]

ACR Breast Density Category c: The breast tissue is heterogeneously
dense, which may obscure small masses.
FINDINGS: 3D tomographic and 2D generated true lateral and spot compression
craniocaudal views of the left breast were obtained. These confirm a
1.5 cm oval asymmetry in the posterior aspect of the upper-outer
left breast. This has mildly irregular and indistinct margins.

Mammographic images were processed with CAD.

On physical exam, no mass is palpable in the outer left breast.

Targeted ultrasound is performed, showing a focal area of
fibrocystic tissue in the 3 o'clock position of the left breast, 7
cm from the nipple. This most likely correlates to the mammographic
finding with fatty tissue demonstrated throughout the remainder of
that portion of the breast. More inferiorly in the lower outer left
breast, in the 5 o'clock position, there is a larger amount of
similar appearing tissue.
IMPRESSION: Persistent asymmetry in the lateral left breast, most likely
correlating to benign fibrocystic tissue at ultrasound.

RECOMMENDATION:
Left diagnostic mammogram and possible left breast ultrasound in 6
months. This has been discussed with the patient.

I have discussed the findings and recommendations with the patient.
If applicable, a reminder letter will be sent to the patient
regarding the next appointment.

BI-RADS CATEGORY  3: Probably benign.
